# Patient Record
Sex: Female | Born: 1955 | Race: White | Hispanic: No | Smoking: Current every day smoker
Health system: Southern US, Community
[De-identification: ages and names within clinical notes are randomized; demographics above are authoritative.]

## PROBLEM LIST (undated history)

## (undated) DIAGNOSIS — Z8781 Personal history of (healed) traumatic fracture: Secondary | ICD-10-CM

## (undated) HISTORY — DX: Personal history of (healed) traumatic fracture: Z87.81

---

## 1999-02-16 ENCOUNTER — Emergency Department (HOSPITAL_COMMUNITY): Admission: EM | Admit: 1999-02-16 | Discharge: 1999-02-16 | Payer: Self-pay

## 1999-02-16 ENCOUNTER — Encounter: Payer: Self-pay | Admitting: Emergency Medicine

## 1999-02-25 ENCOUNTER — Emergency Department (HOSPITAL_COMMUNITY): Admission: EM | Admit: 1999-02-25 | Discharge: 1999-02-25 | Payer: Self-pay | Admitting: Emergency Medicine

## 2000-09-01 ENCOUNTER — Encounter: Payer: Self-pay | Admitting: Emergency Medicine

## 2000-09-01 ENCOUNTER — Emergency Department (HOSPITAL_COMMUNITY): Admission: EM | Admit: 2000-09-01 | Discharge: 2000-09-01 | Payer: Self-pay | Admitting: Emergency Medicine

## 2000-11-11 ENCOUNTER — Emergency Department (HOSPITAL_COMMUNITY): Admission: EM | Admit: 2000-11-11 | Discharge: 2000-11-11 | Payer: Self-pay

## 2013-06-15 ENCOUNTER — Encounter (HOSPITAL_COMMUNITY): Payer: Self-pay | Admitting: Emergency Medicine

## 2013-06-15 ENCOUNTER — Emergency Department (HOSPITAL_COMMUNITY)
Admission: EM | Admit: 2013-06-15 | Discharge: 2013-06-16 | Disposition: A | Discharge status: Home / Self Care | Payer: Self-pay | Attending: Emergency Medicine | Admitting: Emergency Medicine

## 2013-06-15 ENCOUNTER — Emergency Department (HOSPITAL_COMMUNITY): Payer: Self-pay

## 2013-06-15 DIAGNOSIS — F17200 Nicotine dependence, unspecified, uncomplicated: Secondary | ICD-10-CM | POA: Insufficient documentation

## 2013-06-15 DIAGNOSIS — Y92410 Unspecified street and highway as the place of occurrence of the external cause: Secondary | ICD-10-CM | POA: Insufficient documentation

## 2013-06-15 DIAGNOSIS — S82209A Unspecified fracture of shaft of unspecified tibia, initial encounter for closed fracture: Secondary | ICD-10-CM

## 2013-06-15 DIAGNOSIS — L559 Sunburn, unspecified: Secondary | ICD-10-CM | POA: Insufficient documentation

## 2013-06-15 DIAGNOSIS — F10929 Alcohol use, unspecified with intoxication, unspecified: Secondary | ICD-10-CM

## 2013-06-15 DIAGNOSIS — S82409A Unspecified fracture of shaft of unspecified fibula, initial encounter for closed fracture: Secondary | ICD-10-CM

## 2013-06-15 DIAGNOSIS — S82899A Other fracture of unspecified lower leg, initial encounter for closed fracture: Secondary | ICD-10-CM | POA: Insufficient documentation

## 2013-06-15 DIAGNOSIS — Y9389 Activity, other specified: Secondary | ICD-10-CM | POA: Insufficient documentation

## 2013-06-15 DIAGNOSIS — F1010 Alcohol abuse, uncomplicated: Secondary | ICD-10-CM | POA: Insufficient documentation

## 2013-06-15 DIAGNOSIS — IMO0002 Reserved for concepts with insufficient information to code with codable children: Secondary | ICD-10-CM | POA: Insufficient documentation

## 2013-06-15 MED ORDER — SODIUM CHLORIDE 0.9 % IV BOLUS (SEPSIS)
1000.0000 mL | Freq: Once | INTRAVENOUS | Status: AC
  Administered 2013-06-15: 1000 mL via INTRAVENOUS

## 2013-06-15 MED ORDER — FENTANYL CITRATE 0.05 MG/ML IJ SOLN
25.0000 ug | Freq: Once | INTRAMUSCULAR | Status: AC
  Administered 2013-06-15: 25 ug via INTRAVENOUS
  Filled 2013-06-15: qty 2

## 2013-06-15 MED ORDER — HYDROMORPHONE HCL PF 1 MG/ML IJ SOLN
1.0000 mg | Freq: Once | INTRAMUSCULAR | Status: AC
  Administered 2013-06-15: 1 mg via INTRAVENOUS
  Filled 2013-06-15: qty 1

## 2013-06-15 MED ORDER — ONDANSETRON HCL 4 MG/2ML IJ SOLN
4.0000 mg | Freq: Once | INTRAMUSCULAR | Status: AC
  Administered 2013-06-15: 4 mg via INTRAVENOUS
  Filled 2013-06-15: qty 2

## 2013-06-15 MED ORDER — SODIUM CHLORIDE 0.9 % IV SOLN
INTRAVENOUS | Status: DC
  Administered 2013-06-15: via INTRAVENOUS
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
## 2013-06-16 ENCOUNTER — Emergency Department (HOSPITAL_COMMUNITY): Payer: Self-pay

## 2013-06-16 ENCOUNTER — Encounter (HOSPITAL_COMMUNITY): Payer: Self-pay | Admitting: Radiology

## 2013-06-16 LAB — CBC WITH DIFFERENTIAL/PLATELET
BASOS ABS: 0 10*3/uL (ref 0.0–0.1)
Basophils Relative: 0 % (ref 0–1)
Eosinophils Absolute: 0 10*3/uL (ref 0.0–0.7)
Eosinophils Relative: 0 % (ref 0–5)
HEMATOCRIT: 40.7 % (ref 36.0–46.0)
Hemoglobin: 14.3 g/dL (ref 12.0–15.0)
LYMPHS PCT: 16 % (ref 12–46)
Lymphs Abs: 1.8 10*3/uL (ref 0.7–4.0)
MCH: 34.8 pg — ABNORMAL HIGH (ref 26.0–34.0)
MCHC: 35.1 g/dL (ref 30.0–36.0)
MCV: 99 fL (ref 78.0–100.0)
MONO ABS: 0.5 10*3/uL (ref 0.1–1.0)
MONOS PCT: 4 % (ref 3–12)
Neutro Abs: 8.7 10*3/uL — ABNORMAL HIGH (ref 1.7–7.7)
Neutrophils Relative %: 80 % — ABNORMAL HIGH (ref 43–77)
Platelets: 294 10*3/uL (ref 150–400)
RBC: 4.11 MIL/uL (ref 3.87–5.11)
RDW: 12.9 % (ref 11.5–15.5)
WBC: 11 10*3/uL — ABNORMAL HIGH (ref 4.0–10.5)

## 2013-06-16 LAB — ETHANOL: Alcohol, Ethyl (B): 175 mg/dL — ABNORMAL HIGH (ref 0–11)

## 2013-06-16 LAB — BASIC METABOLIC PANEL
BUN: 7 mg/dL (ref 6–23)
CALCIUM: 8.4 mg/dL (ref 8.4–10.5)
CO2: 22 meq/L (ref 19–32)
CREATININE: 0.48 mg/dL — AB (ref 0.50–1.10)
Chloride: 99 mEq/L (ref 96–112)
GFR calc Af Amer: >90 mL/min (ref >90)
GFR calc non Af Amer: >90 mL/min (ref >90)
Glucose, Bld: 96 mg/dL (ref 70–99)
Potassium: 4.5 mEq/L (ref 3.7–5.3)
Sodium: 135 mEq/L — ABNORMAL LOW (ref 137–147)

## 2013-06-16 MED ORDER — FENTANYL CITRATE 0.05 MG/ML IJ SOLN
50.0000 ug | INTRAMUSCULAR | Status: DC | PRN
  Administered 2013-06-16: 50 ug via INTRAVENOUS
  Filled 2013-06-16: qty 2

## 2013-06-16 MED ORDER — HYDROCODONE-ACETAMINOPHEN 5-325 MG PO TABS: 1.0000 | ORAL_TABLET | Freq: Four times a day (QID) | ORAL | Status: DC | PRN

## 2013-06-16 MED ORDER — IOHEXOL 300 MG/ML  SOLN
80.0000 mL | Freq: Once | INTRAMUSCULAR | Status: AC | PRN
  Administered 2013-06-16: 80 mL via INTRAVENOUS

## 2013-06-16 MED ORDER — IBUPROFEN 600 MG PO TABS: 600.0000 mg | ORAL_TABLET | Freq: Four times a day (QID) | ORAL | Status: DC | PRN
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
## 2013-06-16 NOTE — Discharge Instructions (Signed)
Tibial and Fibular Fracture, Adult    Tibial and fibular fracture is a break in the bones of your lower leg (tibia and fibula). The tibia is the larger of these two bones. The fibula is the smaller of the two bones. It is on the outer side of your leg.  CAUSES  Low-energy injuries, such as a fall from ground level.  High-energy injuries, such as motor vehicle injuries, gunshot wounds, or high-speed sports collisions. RISK FACTORS  Jumping activities.  Repetitive stress, such as long-distance running.  Participation in sports.  Osteoporosis.  Advanced age. SIGNS AND SYMPTOMS  Pain.  Swelling.  Inability to put weight on your injured leg.  Bone deformities at the site of your injury.  Bruising. DIAGNOSIS  Tibial and fibular fractures are diagnosed with the use of X-ray exams.  TREATMENT  If you have a simple fracture of these two bones, they can be treated with simple immobilization. A cast or splint will be used on your leg to keep it from moving while it heals. Then you can begin range-of-motion exercises to regain your knee motion.  HOME CARE INSTRUCTIONS  Apply ice to your leg:  Put ice in a plastic bag.  Place a towel between your skin and the bag.  Leave the ice on for 20 minutes, 2 3 times a day.  If you have a plaster or fiberglass cast:  Do not try to scratch the skin under the cast using sharp or pointed objects.  Check the skin around the cast every day. You may put lotion on any red or sore areas.  Keep your cast dry and clean.  If you have a plaster splint:  Wear the splint as directed.  You may loosen the elastic around the splint if your toes become numb, tingle, or turn cold or blue.  Do not put pressure on any part of your cast or splint until it is fully hardened, because it may deform.  Your cast or splint can be protected during bathing with a plastic bag. Do not lower the cast or splint into water.  Use crutches as directed.  Only take over-the-counter or  prescription medicines for pain, discomfort, or fever as directed by your health care provider.  Follow all instructions given to you by your health care provider.  Make and keep all follow-up appointments. SEEK MEDICAL CARE IF:  Your pain is becoming worse rather than better or is not controlled with medicines.  You have increased swelling or redness in the foot.  You begin to lose feeling in your foot or toes. SEEK IMMEDIATE MEDICAL CARE IF:  You develop a cold or blue foot or toes on the injured side.  You develop severe pain in your injured leg, especially if the pain is increased with movement of your toes. MAKE SURE YOU:  Understand these instructions.  Will watch your condition.  Will get help right away if you are not doing well or get worse. Document Released: 10/18/2001 Document Revised: 11/16/2012 Document Reviewed: 09/07/2012  [facility] Patient Information 2014 [employer], [place].

## 2013-06-16 NOTE — ED Notes (Signed)
Pt's spO2 alarm went off stating that the pt was satting 63% on room air. This RN placed the pt on 3L of O2, and her sats increased to 93%. Dierdre Highmanpitz, MD is aware.

## 2013-06-16 NOTE — ED Notes (Signed)
Pt attempting to put on paper scrubs by herself. This RN offered to help and was scolded by the pt. The pt eventually let this RN help her get dressed, and into a wheel chair.

## 2013-06-16 NOTE — ED Notes (Signed)
Ortho responded to page. Will see the pt shortly.

## 2013-06-16 NOTE — ED Notes (Signed)
Opitz, MD at bedside. 
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
## 2013-06-16 NOTE — ED Notes (Signed)
Pt satting 87% on room air. This RN placed the pt back on 3L of O2, and her sats increased to 98%. Dierdre Highmanpitz, MD is aware, and agrees to the let the pt be discharged.

## 2013-06-16 NOTE — ED Notes (Signed)
Ortho tech at bedside 
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
## 2016-09-03 ENCOUNTER — Encounter (HOSPITAL_COMMUNITY): Payer: Self-pay | Admitting: Emergency Medicine

## 2016-09-03 ENCOUNTER — Emergency Department (HOSPITAL_COMMUNITY)
Admission: EM | Admit: 2016-09-03 | Discharge: 2016-09-03 | Disposition: A | Discharge status: Home / Self Care | Payer: Self-pay | Attending: Emergency Medicine | Admitting: Emergency Medicine

## 2016-09-03 DIAGNOSIS — F17210 Nicotine dependence, cigarettes, uncomplicated: Secondary | ICD-10-CM | POA: Insufficient documentation

## 2016-09-03 DIAGNOSIS — F22 Delusional disorders: Secondary | ICD-10-CM | POA: Diagnosis present

## 2016-09-03 DIAGNOSIS — Z202 Contact with and (suspected) exposure to infections with a predominantly sexual mode of transmission: Secondary | ICD-10-CM | POA: Insufficient documentation

## 2016-09-03 DIAGNOSIS — R440 Auditory hallucinations: Secondary | ICD-10-CM | POA: Insufficient documentation

## 2016-09-03 LAB — CBC WITH DIFFERENTIAL/PLATELET
Basophils Absolute: 0 10*3/uL (ref 0.0–0.1)
Basophils Relative: 0 %
EOS PCT: 1 %
Eosinophils Absolute: 0.1 10*3/uL (ref 0.0–0.7)
HEMATOCRIT: 41.5 % (ref 36.0–46.0)
Hemoglobin: 14.5 g/dL (ref 12.0–15.0)
LYMPHS PCT: 24 %
Lymphs Abs: 2.9 10*3/uL (ref 0.7–4.0)
MCH: 34.6 pg — AB (ref 26.0–34.0)
MCHC: 34.9 g/dL (ref 30.0–36.0)
MCV: 99 fL (ref 78.0–100.0)
Monocytes Absolute: 0.7 10*3/uL (ref 0.1–1.0)
Monocytes Relative: 6 %
NEUTROS ABS: 8.4 10*3/uL — AB (ref 1.7–7.7)
Neutrophils Relative %: 69 %
Platelets: 333 10*3/uL (ref 150–400)
RBC: 4.19 MIL/uL (ref 3.87–5.11)
RDW: 12.8 % (ref 11.5–15.5)
WBC: 12 10*3/uL — ABNORMAL HIGH (ref 4.0–10.5)

## 2016-09-03 LAB — URINALYSIS, ROUTINE W REFLEX MICROSCOPIC
Glucose, UA: NEGATIVE mg/dL
HGB URINE DIPSTICK: NEGATIVE
Ketones, ur: 20 mg/dL — AB
Leukocytes, UA: NEGATIVE
Nitrite: NEGATIVE
Protein, ur: NEGATIVE mg/dL
Specific Gravity, Urine: 1.025 (ref 1.005–1.030)
pH: 5 (ref 5.0–8.0)

## 2016-09-03 LAB — BASIC METABOLIC PANEL
ANION GAP: 9 (ref 5–15)
BUN: 12 mg/dL (ref 6–20)
CALCIUM: 9.4 mg/dL (ref 8.9–10.3)
CO2: 27 mmol/L (ref 22–32)
CREATININE: 0.57 mg/dL (ref 0.44–1.00)
Chloride: 104 mmol/L (ref 101–111)
GFR calc Af Amer: >60 mL/min (ref >60)
GFR calc non Af Amer: >60 mL/min (ref >60)
Glucose, Bld: 129 mg/dL — ABNORMAL HIGH (ref 65–99)
Potassium: 3.7 mmol/L (ref 3.5–5.1)
Sodium: 140 mmol/L (ref 135–145)

## 2016-09-03 LAB — RAPID URINE DRUG SCREEN, HOSP PERFORMED
Amphetamines: NOT DETECTED
Barbiturates: NOT DETECTED
Benzodiazepines: NOT DETECTED
Cocaine: NOT DETECTED
Opiates: NOT DETECTED
TETRAHYDROCANNABINOL: NOT DETECTED

## 2016-09-03 LAB — ETHANOL: Alcohol, Ethyl (B): <5 mg/dL (ref <5)
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
## 2016-09-03 NOTE — ED Notes (Signed)
Pt sent by Mobile Crisis. With triage pt verbalizes police are watching her all the time; denies SI/HI or A/VH but appears paranoid.
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
## 2016-09-04 LAB — HIV ANTIBODY (ROUTINE TESTING W REFLEX): HIV SCREEN 4TH GENERATION: NONREACTIVE

## 2016-09-15 ENCOUNTER — Encounter (HOSPITAL_COMMUNITY): Payer: Self-pay | Admitting: Family Medicine

## 2016-09-15 DIAGNOSIS — R141 Gas pain: Secondary | ICD-10-CM | POA: Insufficient documentation

## 2016-09-15 DIAGNOSIS — F17210 Nicotine dependence, cigarettes, uncomplicated: Secondary | ICD-10-CM | POA: Insufficient documentation

## 2016-09-15 DIAGNOSIS — R142 Eructation: Secondary | ICD-10-CM | POA: Insufficient documentation

## 2016-09-15 DIAGNOSIS — K5900 Constipation, unspecified: Secondary | ICD-10-CM | POA: Insufficient documentation

## 2016-09-15 DIAGNOSIS — R143 Flatulence: Secondary | ICD-10-CM | POA: Insufficient documentation

## 2016-09-15 NOTE — ED Notes (Signed)
Called pt's name to be triaged, but no response from the waiting room. 

## 2016-09-15 NOTE — ED Triage Notes (Signed)
Patient is complaining of left upper abd pain with gas, constipation, distention, and dizziness. Symptoms started about 3 weeks ago. Denies fever, nausea, and vomiting.

## 2016-09-16 ENCOUNTER — Emergency Department (HOSPITAL_COMMUNITY)
Admission: EM | Admit: 2016-09-16 | Discharge: 2016-09-16 | Disposition: A | Discharge status: Home / Self Care | Payer: Self-pay | Attending: Emergency Medicine | Admitting: Emergency Medicine

## 2016-09-16 DIAGNOSIS — R141 Gas pain: Secondary | ICD-10-CM

## 2016-09-16 DIAGNOSIS — K5900 Constipation, unspecified: Secondary | ICD-10-CM

## 2016-09-16 DIAGNOSIS — R142 Eructation: Secondary | ICD-10-CM

## 2016-09-16 DIAGNOSIS — R143 Flatulence: Secondary | ICD-10-CM

## 2016-09-16 LAB — CBC
HEMATOCRIT: 41.4 % (ref 36.0–46.0)
Hemoglobin: 14.5 g/dL (ref 12.0–15.0)
MCH: 34 pg (ref 26.0–34.0)
MCHC: 35 g/dL (ref 30.0–36.0)
MCV: 97.2 fL (ref 78.0–100.0)
Platelets: 316 10*3/uL (ref 150–400)
RBC: 4.26 MIL/uL (ref 3.87–5.11)
RDW: 13 % (ref 11.5–15.5)
WBC: 9.1 10*3/uL (ref 4.0–10.5)

## 2016-09-16 LAB — COMPREHENSIVE METABOLIC PANEL
ALBUMIN: 3.9 g/dL (ref 3.5–5.0)
ALT: 17 U/L (ref 14–54)
AST: 14 U/L — AB (ref 15–41)
Alkaline Phosphatase: 70 U/L (ref 38–126)
Anion gap: 9 (ref 5–15)
BUN: 8 mg/dL (ref 6–20)
CHLORIDE: 105 mmol/L (ref 101–111)
CO2: 29 mmol/L (ref 22–32)
Calcium: 9.5 mg/dL (ref 8.9–10.3)
Creatinine, Ser: 0.53 mg/dL (ref 0.44–1.00)
GFR calc Af Amer: >60 mL/min (ref >60)
GFR calc non Af Amer: >60 mL/min (ref >60)
GLUCOSE: 97 mg/dL (ref 65–99)
POTASSIUM: 4.4 mmol/L (ref 3.5–5.1)
Sodium: 143 mmol/L (ref 135–145)
Total Bilirubin: 0.3 mg/dL (ref 0.3–1.2)
Total Protein: 7.5 g/dL (ref 6.5–8.1)

## 2016-09-16 LAB — LIPASE, BLOOD: LIPASE: 27 U/L (ref 11–51)

## 2016-09-16 LAB — URINALYSIS, ROUTINE W REFLEX MICROSCOPIC
BILIRUBIN URINE: NEGATIVE
GLUCOSE, UA: NEGATIVE mg/dL
Hgb urine dipstick: NEGATIVE
KETONES UR: NEGATIVE mg/dL
Leukocytes, UA: NEGATIVE
Nitrite: NEGATIVE
PH: 5 (ref 5.0–8.0)
Protein, ur: NEGATIVE mg/dL
Specific Gravity, Urine: 1.008 (ref 1.005–1.030)

## 2016-09-16 MED ORDER — POLYETHYLENE GLYCOL 3350 17 GM/SCOOP PO POWD: 17.0000 g | Freq: Every day | ORAL | 0 refills | Status: DC

## 2016-09-16 NOTE — ED Provider Notes (Signed)
[facility]-EMERGENCY DEPT Provider Note   CSN: [id number] Arrival date & time: 09/15/16  2145    History   Chief Complaint Chief Complaint  Patient presents with  . Abdominal Pain    HPI CLAUDIE BRICKHOUSE is a 61 y.o. female.  61 year old female with a hx of delusional d/o presents to the ED for evaluation of abdominal discomfort. She reports vague discomfort x 3 weeks intermittently. Symptoms associated with intermittent abdominal bloating, constipation, and dizziness. Patient has taken ibuprofen for symptoms in the past, but has run out of this medication recently. She had a normal BM today; states she has noticed increased flatulence as well. Patient denies fever, urinary symptoms such as dysuria or hematuria, melena, hematochezia, nausea, and vomiting. No hx of abdominal surgeries.      History reviewed. No pertinent past medical history.  Patient Active Problem List   Diagnosis Date Noted  . Delusional disorder (HCC) 09/03/2016    History reviewed. No pertinent surgical history.  OB History    No data available       Home Medications    Prior to Admission medications   Medication Sig Start Date End Date Taking? Authorizing Provider  polyethylene glycol powder (GLYCOLAX/MIRALAX) powder Take 17 g by mouth daily. Until daily soft stools  OTC 09/16/16   Antony Madura, PA-C    Family History History reviewed. No pertinent family history.  Social History Social History  Substance Use Topics  . Smoking status: Current Every Day Smoker    Packs/day: 1.00    Types: Cigarettes  . Smokeless tobacco: Never Used  . Alcohol use Yes     Comment: 4 nights a week     Allergies   Patient has no known allergies.   Review of Systems Review of Systems Ten systems reviewed and are negative for acute change, except as noted in the HPI.    Physical Exam Updated Vital Signs BP 102/86 (BP Location: Right Arm)   Pulse 84   Temp 98 F (36.7 C) (Oral)   Resp 18   Ht 5\' 2"   (1.575 m)   Wt 51 kg (112 lb 8 oz)   SpO2 100%   BMI 20.58 kg/m   Physical Exam  Constitutional: She is oriented to person, place, and time. She appears well-developed and well-nourished. No distress.  Nontoxic and in NAD  HENT:  Head: Normocephalic and atraumatic.  Eyes: Conjunctivae and EOM are normal. No scleral icterus.  Neck: Normal range of motion.  Cardiovascular: Normal rate, regular rhythm and intact distal pulses.   Pulmonary/Chest: Effort normal. No respiratory distress. She has no wheezes.  Respirations even and unlabored. Lungs CTAB.  Abdominal: Soft. She exhibits no distension and no mass. There is no tenderness. There is no guarding.  Soft, nontender abdomen. No masses or peritoneal signs. No distension.  Musculoskeletal: Normal range of motion.  Neurological: She is alert and oriented to person, place, and time. She exhibits normal muscle tone. Coordination normal.  Skin: Skin is warm and dry. No rash noted. She is not diaphoretic. No erythema. No pallor.  Psychiatric: She has a normal mood and affect. Her behavior is normal.  Nursing note and vitals reviewed.    ED Treatments / Results  Labs (all labs ordered are listed, but only abnormal results are displayed) Labs Reviewed  COMPREHENSIVE METABOLIC PANEL - Abnormal; Notable for the following:       Result Value   AST 14 (*)    All other components within normal  limits  LIPASE, BLOOD  CBC  URINALYSIS, ROUTINE W REFLEX MICROSCOPIC    EKG  EKG Interpretation None       Radiology No results found.  Procedures Procedures (including critical care time)  Medications Ordered in ED Medications - No data to display   Initial Impression / Assessment and Plan / ED Course  I have reviewed the triage vital signs and the nursing notes.  Pertinent labs & imaging results that were available during my care of the patient were reviewed by me and considered in my medical decision making (see chart for  details).     61 year old female presents to the emergency department for 3 weeks of intermittent abdominal discomfort associated with bloating and constipation. She has had increased flatulence lately and a normal bowel movement today. No nausea or vomiting. No melena or hematochezia. Patient has no evidence of acute surgical abdomen. Laboratory workup reassuring. Given chronicity of symptoms and reassuring exam with stable vital signs, low suspicion for emergent etiology. Plan for continued outpatient management. MiraLAX prescribed given reported history of constipation intermittently. Return precautions provided at discharge. Patient agreeable to plan with no unaddressed concerns.   Final Clinical Impressions(s) / ED Diagnoses   Final diagnoses:  Flatulence, eructation and gas pain  Constipation, unspecified constipation type    New Prescriptions New Prescriptions   POLYETHYLENE GLYCOL POWDER (GLYCOLAX/MIRALAX) POWDER    Take 17 g by mouth daily. Until daily soft stools  OTC     Antony MaduraHumes, Loleta Frommelt, PA-C 09/16/16 [id number]    Gilda CreasePollina, Christopher J, MD 09/16/16 0730

## 2016-10-31 ENCOUNTER — Encounter (HOSPITAL_COMMUNITY): Payer: Self-pay

## 2016-10-31 ENCOUNTER — Emergency Department (HOSPITAL_COMMUNITY): Payer: Self-pay

## 2016-10-31 ENCOUNTER — Emergency Department (HOSPITAL_COMMUNITY)
Admission: EM | Admit: 2016-10-31 | Discharge: 2016-10-31 | Disposition: A | Discharge status: Home / Self Care | Payer: Self-pay | Attending: Emergency Medicine | Admitting: Emergency Medicine

## 2016-10-31 DIAGNOSIS — F17210 Nicotine dependence, cigarettes, uncomplicated: Secondary | ICD-10-CM | POA: Insufficient documentation

## 2016-10-31 DIAGNOSIS — M21372 Foot drop, left foot: Secondary | ICD-10-CM | POA: Insufficient documentation

## 2016-10-31 DIAGNOSIS — I10 Essential (primary) hypertension: Secondary | ICD-10-CM | POA: Insufficient documentation

## 2016-10-31 NOTE — ED Provider Notes (Signed)
[facility]-EMERGENCY DEPT Provider Note   CSN: [id number] Arrival date & time: 10/31/16  1250     History   Chief Complaint Chief Complaint  Patient presents with  . Ankle Pain    HPI Debbie Luna is a 61 y.o. female who presents emergency Department with chief complaint of left ankle weakness. The patient has a history of delusional disorder making history taking difficult as she has some difficulty communicating timeline appropriately. The patient has a history of hypertension and is a daily smoker and does not follow with her physicians. Patient states that she has had 2 recent ankle sprain that has been sleeping on the floor with her ankles and extension. For the past 2 weeks she has had difficulty flexing the ankle and dorsiflexion which has made her ambulation more difficult. She just denies any headaches, changes in vision, unilateral weakness, back pain, saddle anesthesia, radiating pain down the legs. She has no other neurologic complaints. She denies any injuries except for the repeated ankle sprains.  HPI  History reviewed. No pertinent past medical history.  Patient Active Problem List   Diagnosis Date Noted  . Delusional disorder (HCC) 09/03/2016    History reviewed. No pertinent surgical history.  OB History    No data available       Home Medications    Prior to Admission medications   Medication Sig Start Date End Date Taking? Authorizing Provider  polyethylene glycol powder (GLYCOLAX/MIRALAX) powder Take 17 g by mouth daily. Until daily soft stools  OTC 09/16/16   Antony Madura, PA-C    Family History History reviewed. No pertinent family history.  Social History Social History  Substance Use Topics  . Smoking status: Current Every Day Smoker    Packs/day: 1.00    Types: Cigarettes  . Smokeless tobacco: Never Used  . Alcohol use Yes     Comment: 4 nights a week     Allergies   Patient has no known allergies.   Review of Systems Review of  Systems   Ten systems reviewed and are negative for acute change, except as noted in the HPI.    Physical Exam Updated Vital Signs BP (!) 174/95 (BP Location: Right Arm)   Pulse 92   Temp 98.4 F (36.9 C) (Oral)   Resp 18   SpO2 100%   Physical Exam  Constitutional: She is oriented to person, place, and time. She appears well-developed and well-nourished. No distress.  HENT:  Head: Normocephalic and atraumatic.  Eyes: Conjunctivae are normal. No scleral icterus.  Neck: Normal range of motion.  Cardiovascular: Normal rate, regular rhythm and normal heart sounds.  Exam reveals no gallop and no friction rub.   No murmur heard. Pulmonary/Chest: Effort normal and breath sounds normal. No respiratory distress.  Abdominal: Soft. Bowel sounds are normal. She exhibits no distension and no mass. There is no tenderness. There is no guarding.  Neurological: She is alert and oriented to person, place, and time.   Speech is clear and goal oriented, follows commands Major Cranial nerves without deficit, no facial droop Patient with 5 out of 5 strength in bilateral upper and right lower extremity. Isolated weakness with dorsiflexion of the left ankle. DTRs are normal, no hyperreflexia. Sensation normal to light and sharp touch Moves extremities without ataxia, coordination intact Normal finger to nose and rapid alternating movements Neg romberg, no pronator drift Able to toe walk, unable to heel walk on the left    Skin: Skin is warm and dry.  She is not diaphoretic.  Psychiatric: Her behavior is normal.  Nursing note and vitals reviewed.    ED Treatments / Results  Labs (all labs ordered are listed, but only abnormal results are displayed) Labs Reviewed - No data to display  EKG  EKG Interpretation None       Radiology Dg Ankle Complete Left  Result Date: 10/31/2016 CLINICAL DATA:  Dense any toes left foot with swelling. Ankle twisting injury 2 weeks ago. EXAM: LEFT ANKLE  COMPLETE - 3+ VIEW COMPARISON:  None. FINDINGS: There is no evidence of fracture, dislocation, or joint effusion. There is no evidence of arthropathy or other focal bone abnormality. Soft tissues are unremarkable. IMPRESSION: Negative. Electronically Signed   By: Elberta Fortis M.D.   On: 10/31/2016 14:30    Procedures Procedures (including critical care time)  Medications Ordered in ED Medications - No data to display   Initial Impression / Assessment and Plan / ED Course  I have reviewed the triage vital signs and the nursing notes.  Pertinent labs & imaging results that were available during my care of the patient were reviewed by me and considered in my medical decision making (see chart for details).     Patient with isolated footdrop on the left. There are no other neurologic abnormalities. I discussed the case with Dr. Wilford Corner of neurology. He states that given the physical exam findings no imaging of the spine or head is currently necessary. She'll be placed in an AFO splint and given outpatient neurology follow-up. I discussed return precautions with the patient she appears safe for discharge at this time  Final Clinical Impressions(s) / ED Diagnoses   Final diagnoses:  None    New Prescriptions New Prescriptions   No medications on file     Arthor Captain, PA-C 10/31/16 1713    Benjiman Core, MD 11/01/16 1000

## 2016-10-31 NOTE — ED Triage Notes (Signed)
Pt states she is here for ankle pain, she reports she tripped and twisted her left ankle 3 weeks ago, no swelling noted.

## 2016-10-31 NOTE — Discharge Instructions (Signed)
Contact a health care provider if: Your symptoms do not get better in 2-3 months. The weakness or numbness in your leg or foot gets worse.

## 2016-10-31 NOTE — ED Notes (Signed)
Pt requesting HIV test.  Denies symptoms. PA made aware.

## 2016-10-31 NOTE — ED Notes (Signed)
Pt refused splint, left without discharge paperwork.

## 2016-10-31 NOTE — ED Notes (Signed)
This RN paged ortho for splint.

## 2017-02-05 ENCOUNTER — Encounter (HOSPITAL_COMMUNITY): Payer: Self-pay | Admitting: *Deleted

## 2017-02-05 ENCOUNTER — Emergency Department (HOSPITAL_COMMUNITY)
Admission: EM | Admit: 2017-02-05 | Discharge: 2017-02-08 | Disposition: A | Discharge status: Other Acute Inpatient Hospital | Payer: Self-pay | Attending: Emergency Medicine | Admitting: Emergency Medicine

## 2017-02-05 DIAGNOSIS — F17210 Nicotine dependence, cigarettes, uncomplicated: Secondary | ICD-10-CM | POA: Insufficient documentation

## 2017-02-05 DIAGNOSIS — F200 Paranoid schizophrenia: Secondary | ICD-10-CM | POA: Diagnosis present

## 2017-02-05 DIAGNOSIS — Z049 Encounter for examination and observation for unspecified reason: Secondary | ICD-10-CM

## 2017-02-05 DIAGNOSIS — Z79899 Other long term (current) drug therapy: Secondary | ICD-10-CM | POA: Insufficient documentation

## 2017-02-05 DIAGNOSIS — F22 Delusional disorders: Secondary | ICD-10-CM | POA: Insufficient documentation

## 2017-02-05 LAB — RAPID URINE DRUG SCREEN, HOSP PERFORMED
AMPHETAMINES: NOT DETECTED
BENZODIAZEPINES: NOT DETECTED
Barbiturates: NOT DETECTED
COCAINE: NOT DETECTED
OPIATES: NOT DETECTED
TETRAHYDROCANNABINOL: NOT DETECTED

## 2017-02-05 LAB — COMPREHENSIVE METABOLIC PANEL
ALT: 12 U/L — AB (ref 14–54)
AST: 22 U/L (ref 15–41)
Albumin: 3.8 g/dL (ref 3.5–5.0)
Alkaline Phosphatase: 73 U/L (ref 38–126)
Anion gap: 10 (ref 5–15)
BUN: 19 mg/dL (ref 6–20)
CHLORIDE: 101 mmol/L (ref 101–111)
CO2: 27 mmol/L (ref 22–32)
CREATININE: 0.68 mg/dL (ref 0.44–1.00)
Calcium: 9.2 mg/dL (ref 8.9–10.3)
Glucose, Bld: 113 mg/dL — ABNORMAL HIGH (ref 65–99)
Potassium: 3.7 mmol/L (ref 3.5–5.1)
SODIUM: 138 mmol/L (ref 135–145)
Total Bilirubin: 0.7 mg/dL (ref 0.3–1.2)
Total Protein: 6.8 g/dL (ref 6.5–8.1)

## 2017-02-05 LAB — CBC
HCT: 41.4 % (ref 36.0–46.0)
HEMOGLOBIN: 14.1 g/dL (ref 12.0–15.0)
MCH: 33.7 pg (ref 26.0–34.0)
MCHC: 34.1 g/dL (ref 30.0–36.0)
MCV: 98.8 fL (ref 78.0–100.0)
PLATELETS: 305 10*3/uL (ref 150–400)
RBC: 4.19 MIL/uL (ref 3.87–5.11)
RDW: 14.3 % (ref 11.5–15.5)
WBC: 7.8 10*3/uL (ref 4.0–10.5)

## 2017-02-05 LAB — ETHANOL

## 2017-02-05 LAB — SALICYLATE LEVEL

## 2017-02-05 LAB — ACETAMINOPHEN LEVEL
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
## 2017-02-05 NOTE — ED Notes (Signed)
Hourly rounding reveals patient in room. No complaints, stable, in no acute distress. Q15 minute rounds and monitoring via Security Cameras to continue. 

## 2017-02-05 NOTE — ED Provider Notes (Signed)
[facility]-EMERGENCY DEPT Provider Note   CSN: [id number] Arrival date & time: 02/05/17  1832     History   Chief Complaint Chief Complaint  Patient presents with  . IVC    HPI Debbie Luna is a 61 y.o. female.  HPI Patient was brought into the emergency room because of abnormal behavior.  Patient was brought into the emergency room by police.  Patient is not sure who called the police.  She did not call them.  She denies having any complaints and states she is just here for checkup.  She denies any suicidal or homicidal ideation.  When I asked her if she is hearing any voices or seeing things.  Patient started to tell me that there are people who placed cameras  in her house.  She starts talking about a prostitution ring and a game that people are playing with her.  She denies using any drugs.  She does occasionally drink alcohol. History reviewed. No pertinent past medical history.  Patient Active Problem List   Diagnosis Date Noted  . Delusional disorder (HCC) 09/03/2016    History reviewed. No pertinent surgical history.  OB History    No data available       Home Medications    Prior to Admission medications   Medication Sig Start Date End Date Taking? Authorizing Provider  polyethylene glycol powder (GLYCOLAX/MIRALAX) powder Take 17 g by mouth daily. Until daily soft stools  OTC 09/16/16   Antony MaduraHumes, Kelly, PA-C    Family History No family history on file.  Social History Social History   Tobacco Use  . Smoking status: Current Every Day Smoker    Packs/day: 1.00    Types: Cigarettes  . Smokeless tobacco: Never Used  Substance Use Topics  . Alcohol use: Yes    Comment: 4 nights a week  . Drug use: No     Allergies   Patient has no known allergies.   Review of Systems Review of Systems  All other systems reviewed and are negative.    Physical Exam Updated Vital Signs BP 111/65 (BP Location: Left Arm)   Pulse 82    Temp 97.8 F (36.6 C) (Oral)   Resp 18   SpO2 100%   Physical Exam  Constitutional: She appears well-developed and well-nourished. No distress.  HENT:  Head: Normocephalic and atraumatic.  Right Ear: External ear normal.  Left Ear: External ear normal.  Eyes: Conjunctivae are normal. Right eye exhibits no discharge. Left eye exhibits no discharge. No scleral icterus.  Neck: Neck supple. No tracheal deviation present.  Cardiovascular: Normal rate, regular rhythm and intact distal pulses.  Pulmonary/Chest: Effort normal and breath sounds normal. No stridor. No respiratory distress. She has no wheezes. She has no rales.  Abdominal: Soft. Bowel sounds are normal. She exhibits no distension. There is no tenderness. There is no rebound and no guarding.  Musculoskeletal: She exhibits no edema or tenderness.  Neurological: She is alert. She has normal strength. No cranial nerve deficit (no facial droop, extraocular movements intact, no slurred speech) or sensory deficit. She exhibits normal muscle tone. She displays no seizure activity. Coordination normal.  Skin: Skin is warm and dry. No rash noted.  Psychiatric: She has a normal mood and affect. Her speech is tangential. Her speech is not rapid and/or pressured and not slurred. Thought content is delusional. She expresses no homicidal and no suicidal ideation. She expresses no suicidal plans and no homicidal plans. She is communicative.  Nursing note and vitals reviewed.    ED Treatments / Results  Labs (all labs ordered are listed, but only abnormal results are displayed) Labs Reviewed  COMPREHENSIVE METABOLIC PANEL - Abnormal; Notable for the following components:      Result Value   Glucose, Bld 113 (*)    ALT 12 (*)    All other components within normal limits  ACETAMINOPHEN LEVEL - Abnormal; Notable for the following components:   Acetaminophen (Tylenol), Serum <10 (*)    All other components within normal limits  ETHANOL    SALICYLATE LEVEL  CBC  RAPID URINE DRUG SCREEN, HOSP PERFORMED    Procedures Procedures (including critical care time)  Medications Ordered in ED Medications - No data to display   Initial Impression / Assessment and Plan / ED Course  I have reviewed the triage vital signs and the nursing notes.  Pertinent labs & imaging results that were available during my care of the patient were reviewed by me and considered in my medical decision making (see chart for details).   Labs reviewed.  No acute medical conditions noted. Pt is medically clear for psychiatric evaluation.  Final Clinical Impressions(s) / ED Diagnoses   Final diagnoses:  Delusions [facility](HCC)    ED Discharge Orders    None       Linwood DibblesKnapp, Kemyra August, MD 02/05/17 2112

## 2017-02-05 NOTE — ED Triage Notes (Addendum)
IVC pt brought in by police. Pt is hearing voices, has full conversations with herself. Pt denies HI/SI.

## 2017-02-05 NOTE — ED Notes (Signed)
Bed: [facility] Expected date:  Expected time:  Means of arrival:  Comments: 

## 2017-02-05 NOTE — ED Notes (Signed)
Hourly rounding reveals patient sleeping in room. No complaints, stable, in no acute distress. Q15 minute rounds and monitoring via Security Cameras to continue. 

## 2017-02-05 NOTE — BH Assessment (Signed)
[facility] Assessment Progress Note  Per Nira ConnJason Berry, NP pt meets criteria for inpt treatment. [facility] at capacity for 500 hall type beds. Pt's nurse Jillyn HiddenGary, RN has been notified of the recommendation. TTS attempted to contact the EDP to advise of the disposition but line continues to ring. Will attempt to notify the EDP at a later time.   Princess BruinsAquicha Delila Kuklinski, MSW, LCSW Therapeutic Triage Specialist  [phone]

## 2017-02-05 NOTE — ED Notes (Signed)
Pt. Transferred to [facility] from ED to room 34 after screening for contraband. Report to include Situation, Background, Assessment and Recommendations from [facility]. Pt. Oriented to unit including Q15 minute rounds as well as the security cameras for their protection. Patient is alert and oriented, warm and dry in no acute distress. Patient denies SI, HI, and AVH. Pt. Encouraged to let me know if needs arise.

## 2017-02-06 ENCOUNTER — Emergency Department (HOSPITAL_COMMUNITY): Payer: Self-pay

## 2017-02-06 DIAGNOSIS — F200 Paranoid schizophrenia: Secondary | ICD-10-CM | POA: Diagnosis present

## 2017-02-06 DIAGNOSIS — R450 Nervousness: Secondary | ICD-10-CM

## 2017-02-06 DIAGNOSIS — R443 Hallucinations, unspecified: Secondary | ICD-10-CM

## 2017-02-06 DIAGNOSIS — F17210 Nicotine dependence, cigarettes, uncomplicated: Secondary | ICD-10-CM

## 2017-02-06 DIAGNOSIS — F22 Delusional disorders: Secondary | ICD-10-CM

## 2017-02-06 DIAGNOSIS — F419 Anxiety disorder, unspecified: Secondary | ICD-10-CM

## 2017-02-06 LAB — URINALYSIS, ROUTINE W REFLEX MICROSCOPIC
Bilirubin Urine: NEGATIVE
GLUCOSE, UA: NEGATIVE mg/dL
HGB URINE DIPSTICK: NEGATIVE
Ketones, ur: NEGATIVE mg/dL
LEUKOCYTES UA: NEGATIVE
Nitrite: NEGATIVE
PH: 6 (ref 5.0–8.0)
Protein, ur: NEGATIVE mg/dL
Specific Gravity, Urine: 1.013 (ref 1.005–1.030)

## 2017-02-06 MED ORDER — RISPERIDONE 1 MG PO TABS
1.0000 mg | ORAL_TABLET | Freq: Two times a day (BID) | ORAL | Status: DC
  Administered 2017-02-06 – 2017-02-08 (×5): 1 mg via ORAL
  Filled 2017-02-06 (×5): qty 1

## 2017-02-06 MED ORDER — HYDROXYZINE HCL 10 MG PO TABS
10.0000 mg | ORAL_TABLET | Freq: Two times a day (BID) | ORAL | Status: DC
  Administered 2017-02-06 – 2017-02-08 (×5): 10 mg via ORAL
  Filled 2017-02-06 (×5): qty 1

## 2017-02-06 NOTE — ED Notes (Signed)
Hourly rounding reveals patient sleeping in room. No complaints, stable, in no acute distress. Q15 minute rounds and monitoring via Security Cameras to continue. 

## 2017-02-06 NOTE — ED Notes (Signed)
Pt ambulatory w/o difficulty to x ray w/ security and x ray tech

## 2017-02-06 NOTE — Consult Note (Signed)
[facility] Consult   Reason for Consult:  Psychosis and delusions Referring Physician:  EDP Patient Identification: Debbie Luna MRN:  [id number] Principal Diagnosis: Paranoid schizophrenia [facility]) Diagnosis:   Patient Active Problem List   Diagnosis Date Noted  . Paranoid schizophrenia ([place]) [F20.0] 02/06/2017    Priority: High  . Delusional disorder ([place]) [F22] 09/03/2016    Total Time spent with patient: 45 minutes  Subjective:   Debbie Luna is a 61 y.o. female patient admitted with psychosis and delusions.  HPI:  Patient with history of Delusions who was IVC'd by her friend due to worsening delusions and psychosis. Patient she has been hearing voices and having full conversations with herself. Also, patient has a fixed believe that people are out to get her, people are are placing recording devices in her home and has not been talking on the phone because she believes it is wiretapped. Collateral information revealed that patient  has been seen wandering in the city and talking to herself.  Past Psychiatric History: as above  Risk to Self: Suicidal Ideation: No Suicidal Intent: No Is patient at risk for suicide?: No Suicidal Plan?: No Access to Means: No What has been your use of drugs/alcohol within the last 12 months?: pt said she can "take it or leave it", per IVC pt drinks beer daily Risk to Others: Homicidal Ideation: No Thoughts of Harm to Others: No Current Homicidal Intent: No Current Homicidal Plan: No Access to Homicidal Means: No History of harm to others?: No Assessment of Violence: None Noted Does patient have access to weapons?: No Criminal Charges Pending?: No Does patient have a court date: No Prior Inpatient Therapy: Prior Inpatient Therapy: No Prior Outpatient Therapy: Prior Outpatient Therapy: No  Past Medical History: History reviewed. No pertinent past medical history. History reviewed. No pertinent surgical history. Family  History: No family history on file. Family Psychiatric  History:  Social History:  Social History   Substance and Sexual Activity  Alcohol Use Yes   Comment: 4 nights a week     Social History   Substance and Sexual Activity  Drug Use No    Social History   Socioeconomic History  . Marital status: Divorced    Spouse name: None  . Number of children: None  . Years of education: None  . Highest education level: None  Social Needs  . Financial resource strain: None  . Food insecurity - worry: None  . Food insecurity - inability: None  . Transportation needs - medical: None  . Transportation needs - non-medical: None  Occupational History  . None  Tobacco Use  . Smoking status: Current Every Day Smoker    Packs/day: 1.00    Types: Cigarettes  . Smokeless tobacco: Never Used  Substance and Sexual Activity  . Alcohol use: Yes    Comment: 4 nights a week  . Drug use: No  . Sexual activity: None  Other Topics Concern  . None  Social History Narrative  . None   Additional Social History:    Allergies:  No Known Allergies  Labs:  Results for orders placed or performed during the hospital encounter of 02/05/17 (from the past 48 hour(s))  Comprehensive metabolic panel     Status: Abnormal   Collection Time: 02/05/17  7:41 PM  Result Value Ref Range   Sodium 138 135 - 145 mmol/L   Potassium 3.7 3.5 - 5.1 mmol/L   Chloride 101 101 - 111 mmol/L   CO2  27 22 - 32 mmol/L   Glucose, Bld 113 (H) 65 - 99 mg/dL   BUN 19 6 - 20 mg/dL   Creatinine, Ser 0.68 0.44 - 1.00 mg/dL   Calcium 9.2 8.9 - 10.3 mg/dL   Total Protein 6.8 6.5 - 8.1 g/dL   Albumin 3.8 3.5 - 5.0 g/dL   AST 22 15 - 41 U/L   ALT 12 (L) 14 - 54 U/L   Alkaline Phosphatase 73 38 - 126 U/L   Total Bilirubin 0.7 0.3 - 1.2 mg/dL   GFR calc non Af Amer >60 >60 mL/min   GFR calc Af Amer >60 >60 mL/min    Comment: (NOTE) The eGFR has been calculated using the CKD EPI equation. This calculation has not been  validated in all clinical situations. eGFR's persistently <60 mL/min signify possible Chronic Kidney Disease.    Anion gap 10 5 - 15  Ethanol     Status: None   Collection Time: 02/05/17  7:41 PM  Result Value Ref Range   Alcohol, Ethyl (B) <10 <10 mg/dL    Comment:        LOWEST DETECTABLE LIMIT FOR SERUM ALCOHOL IS 10 mg/dL FOR MEDICAL PURPOSES ONLY   Salicylate level     Status: None   Collection Time: 02/05/17  7:41 PM  Result Value Ref Range   Salicylate Lvl <9.8 2.8 - 30.0 mg/dL  Acetaminophen level     Status: Abnormal   Collection Time: 02/05/17  7:41 PM  Result Value Ref Range   Acetaminophen (Tylenol), Serum <10 (L) 10 - 30 ug/mL    Comment:        THERAPEUTIC CONCENTRATIONS VARY SIGNIFICANTLY. A RANGE OF 10-30 ug/mL MAY BE AN EFFECTIVE CONCENTRATION FOR MANY PATIENTS. HOWEVER, SOME ARE BEST TREATED AT CONCENTRATIONS OUTSIDE THIS RANGE. ACETAMINOPHEN CONCENTRATIONS >150 ug/mL AT 4 HOURS AFTER INGESTION AND >50 ug/mL AT 12 HOURS AFTER INGESTION ARE OFTEN ASSOCIATED WITH TOXIC REACTIONS.   cbc     Status: None   Collection Time: 02/05/17  7:41 PM  Result Value Ref Range   WBC 7.8 4.0 - 10.5 K/uL   RBC 4.19 3.87 - 5.11 MIL/uL   Hemoglobin 14.1 12.0 - 15.0 g/dL   HCT 41.4 36.0 - 46.0 %   MCV 98.8 78.0 - 100.0 fL   MCH 33.7 26.0 - 34.0 pg   MCHC 34.1 30.0 - 36.0 g/dL   RDW 14.3 11.5 - 15.5 %   Platelets 305 150 - 400 K/uL  Rapid urine drug screen (hospital performed)     Status: None   Collection Time: 02/05/17  8:08 PM  Result Value Ref Range   Opiates NONE DETECTED NONE DETECTED   Cocaine NONE DETECTED NONE DETECTED   Benzodiazepines NONE DETECTED NONE DETECTED   Amphetamines NONE DETECTED NONE DETECTED   Tetrahydrocannabinol NONE DETECTED NONE DETECTED   Barbiturates NONE DETECTED NONE DETECTED    Comment: (NOTE) DRUG SCREEN FOR MEDICAL PURPOSES ONLY.  IF CONFIRMATION IS NEEDED FOR ANY PURPOSE, NOTIFY LAB WITHIN 5 DAYS. LOWEST DETECTABLE  LIMITS FOR URINE DRUG SCREEN Drug Class                     Cutoff (ng/mL) Amphetamine and metabolites    1000 Barbiturate and metabolites    200 Benzodiazepine                 921 Tricyclics and metabolites     300 Opiates and metabolites  300 Cocaine and metabolites        300 THC                            50     Current Facility-Administered Medications  Medication Dose Route Frequency Provider Last Rate Last Dose  . hydrOXYzine (ATARAX/VISTARIL) tablet 10 mg  10 mg Oral BID Devaris Quirk, MD      . risperiDONE (RISPERDAL) tablet 1 mg  1 mg Oral BID Corena Pilgrim, MD       Current Outpatient Medications  Medication Sig Dispense Refill  . polyethylene glycol powder (GLYCOLAX/MIRALAX) powder Take 17 g by mouth daily. Until daily soft stools  OTC (Patient not taking: Reported on 02/05/2017) 119 g 0    Musculoskeletal: Strength & Muscle Tone: within normal limits Gait & Station: normal Patient leans: N/A  Psychiatric Specialty Exam: Physical Exam  Psychiatric: Her speech is normal. Judgment normal. Her affect is labile. She is agitated, aggressive and actively hallucinating. Thought content is paranoid. Cognition and memory are normal.    Review of Systems  Constitutional: Negative.   HENT: Negative.   Eyes: Negative.   Respiratory: Negative.   Cardiovascular: Negative.   Gastrointestinal: Negative.   Genitourinary: Negative.   Musculoskeletal: Negative.   Skin: Negative.   Neurological: Negative.   Endo/Heme/Allergies: Negative.   Psychiatric/Behavioral: Positive for hallucinations. The patient is nervous/anxious.     Blood pressure 101/72, pulse 68, temperature 98.8 F (37.1 C), temperature source Oral, resp. rate 18, SpO2 98 %.There is no height or weight on file to calculate BMI.  General Appearance: Casual  Eye Contact:  Minimal  Speech:  Clear and Coherent  Volume:  Decreased  Mood:  Irritable  Affect:  Blunt  Thought Process:  Disorganized   Orientation:  Full (Time, Place, and Person)  Thought Content:  Delusions and Hallucinations: Auditory  Suicidal Thoughts:  No  Homicidal Thoughts:  No  Memory:  Immediate;   Fair Recent;   Fair Remote;   Fair  Judgement:  Poor  Insight:  Shallow  Psychomotor Activity:  Psychomotor Retardation  Concentration:  Concentration: Fair and Attention Span: Fair  Recall:  [employer] of Knowledge:  Fair  Language:  Good  Akathisia:  No  Handed:  Right  AIMS (if indicated):     Assets:  Communication Skills  ADL's:  Intact  Cognition:  WNL  Sleep:   poor     Treatment Plan Summary: Daily contact with patient to assess and evaluate symptoms and progress in treatment and Medication management Start Risperdal 1 mg bid for psychosis/delusion and Hydroxyzine 10 mg bid for EPD prevention.  Disposition: Recommend psychiatric Inpatient admission when medically cleared.  Corena Pilgrim, MD 02/06/2017 12:23 PM

## 2017-02-06 NOTE — ED Notes (Signed)
Report to include Situation, Background, Assessment, and Recommendations received from Janie RN. Patient alert and oriented, warm and dry, in no acute distress. Patient denies SI, HI, AVH and pain. Patient made aware of Q15 minute rounds and security cameras for their safety. Patient instructed to come to me with needs or concerns.  

## 2017-02-06 NOTE — ED Notes (Signed)
ekg complete, shown to jamison dnp and edp.  Pt is aware that we need a urine sample.

## 2017-02-06 NOTE — ED Notes (Signed)
Eating lunch, pt is aware that she will not be dc'd today.

## 2017-02-07 NOTE — Consult Note (Signed)
[facility] Psych ED Progress Note  02/07/2017 10:45 AM Debbie Luna  MRN:  [id number] Subjective:''I guess I am doing better today.''  Objective: Patient was seen, interviewed, chart was reviewed and case discussed with treatment team. Patient now endorses decreased psychosis and paranoia. She is still hearing voices occasionally but not as bad as few days ago. However, patient still has paranoia, she has a fixed believe that people are out to get her and  people are  placing recording devices in her home. She denies suicidal/homicidal thoughts.  Principal Problem: Paranoid schizophrenia ([place]) Diagnosis:   Patient Active Problem List   Diagnosis Date Noted  . Paranoid schizophrenia ([place]) [F20.0] 02/06/2017    Priority: High  . Delusional disorder ([place]) [F22] 09/03/2016   Total Time spent with patient: 30 minutes  Past Psychiatric History: Delusional disorder  Past Medical History: History reviewed. No pertinent past medical history. History reviewed. No pertinent surgical history. Family History: No family history on file. Family Psychiatric  History: unknown Social History:  Social History   Substance and Sexual Activity  Alcohol Use Yes   Comment: 4 nights a week     Social History   Substance and Sexual Activity  Drug Use No    Social History   Socioeconomic History  . Marital status: Divorced    Spouse name: None  . Number of children: None  . Years of education: None  . Highest education level: None  Social Needs  . Financial resource strain: None  . Food insecurity - worry: None  . Food insecurity - inability: None  . Transportation needs - medical: None  . Transportation needs - non-medical: None  Occupational History  . None  Tobacco Use  . Smoking status: Current Every Day Smoker    Packs/day: 1.00    Types: Cigarettes  . Smokeless tobacco: Never Used  Substance and Sexual Activity  . Alcohol use: Yes    Comment: 4 nights a week  . Drug use: No  .  Sexual activity: None  Other Topics Concern  . None  Social History Narrative  . None    Sleep: Fair  Appetite:  Fair  Current Medications: Current Facility-Administered Medications  Medication Dose Route Frequency Provider Last Rate Last Dose  . hydrOXYzine (ATARAX/VISTARIL) tablet 10 mg  10 mg Oral BID Corena Pilgrim, MD   10 mg at 02/07/17 0924  . risperiDONE (RISPERDAL) tablet 1 mg  1 mg Oral BID Corena Pilgrim, MD   1 mg at 02/07/17 3664   Current Outpatient Medications  Medication Sig Dispense Refill  . polyethylene glycol powder (GLYCOLAX/MIRALAX) powder Take 17 g by mouth daily. Until daily soft stools  OTC (Patient not taking: Reported on 02/05/2017) 119 g 0    Lab Results:  Results for orders placed or performed during the hospital encounter of 02/05/17 (from the past 48 hour(s))  Comprehensive metabolic panel     Status: Abnormal   Collection Time: 02/05/17  7:41 PM  Result Value Ref Range   Sodium 138 135 - 145 mmol/L   Potassium 3.7 3.5 - 5.1 mmol/L   Chloride 101 101 - 111 mmol/L   CO2 27 22 - 32 mmol/L   Glucose, Bld 113 (H) 65 - 99 mg/dL   BUN 19 6 - 20 mg/dL   Creatinine, Ser 0.68 0.44 - 1.00 mg/dL   Calcium 9.2 8.9 - 10.3 mg/dL   Total Protein 6.8 6.5 - 8.1 g/dL   Albumin 3.8 3.5 - 5.0 g/dL  AST 22 15 - 41 U/L   ALT 12 (L) 14 - 54 U/L   Alkaline Phosphatase 73 38 - 126 U/L   Total Bilirubin 0.7 0.3 - 1.2 mg/dL   GFR calc non Af Amer >60 >60 mL/min   GFR calc Af Amer >60 >60 mL/min    Comment: (NOTE) The eGFR has been calculated using the CKD EPI equation. This calculation has not been validated in all clinical situations. eGFR's persistently <60 mL/min signify possible Chronic Kidney Disease.    Anion gap 10 5 - 15  Ethanol     Status: None   Collection Time: 02/05/17  7:41 PM  Result Value Ref Range   Alcohol, Ethyl (B) <10 <10 mg/dL    Comment:        LOWEST DETECTABLE LIMIT FOR SERUM ALCOHOL IS 10 mg/dL FOR MEDICAL PURPOSES ONLY    Salicylate level     Status: None   Collection Time: 02/05/17  7:41 PM  Result Value Ref Range   Salicylate Lvl <4.1 2.8 - 30.0 mg/dL  Acetaminophen level     Status: Abnormal   Collection Time: 02/05/17  7:41 PM  Result Value Ref Range   Acetaminophen (Tylenol), Serum <10 (L) 10 - 30 ug/mL    Comment:        THERAPEUTIC CONCENTRATIONS VARY SIGNIFICANTLY. A RANGE OF 10-30 ug/mL MAY BE AN EFFECTIVE CONCENTRATION FOR MANY PATIENTS. HOWEVER, SOME ARE BEST TREATED AT CONCENTRATIONS OUTSIDE THIS RANGE. ACETAMINOPHEN CONCENTRATIONS >150 ug/mL AT 4 HOURS AFTER INGESTION AND >50 ug/mL AT 12 HOURS AFTER INGESTION ARE OFTEN ASSOCIATED WITH TOXIC REACTIONS.   cbc     Status: None   Collection Time: 02/05/17  7:41 PM  Result Value Ref Range   WBC 7.8 4.0 - 10.5 K/uL   RBC 4.19 3.87 - 5.11 MIL/uL   Hemoglobin 14.1 12.0 - 15.0 g/dL   HCT 41.4 36.0 - 46.0 %   MCV 98.8 78.0 - 100.0 fL   MCH 33.7 26.0 - 34.0 pg   MCHC 34.1 30.0 - 36.0 g/dL   RDW 14.3 11.5 - 15.5 %   Platelets 305 150 - 400 K/uL  Rapid urine drug screen (hospital performed)     Status: None   Collection Time: 02/05/17  8:08 PM  Result Value Ref Range   Opiates NONE DETECTED NONE DETECTED   Cocaine NONE DETECTED NONE DETECTED   Benzodiazepines NONE DETECTED NONE DETECTED   Amphetamines NONE DETECTED NONE DETECTED   Tetrahydrocannabinol NONE DETECTED NONE DETECTED   Barbiturates NONE DETECTED NONE DETECTED    Comment: (NOTE) DRUG SCREEN FOR MEDICAL PURPOSES ONLY.  IF CONFIRMATION IS NEEDED FOR ANY PURPOSE, NOTIFY LAB WITHIN 5 DAYS. LOWEST DETECTABLE LIMITS FOR URINE DRUG SCREEN Drug Class                     Cutoff (ng/mL) Amphetamine and metabolites    1000 Barbiturate and metabolites    200 Benzodiazepine                 937 Tricyclics and metabolites     300 Opiates and metabolites        300 Cocaine and metabolites        300 THC                            50   Urinalysis, Routine w reflex microscopic      Status: None   Collection  Time: 02/06/17  2:06 PM  Result Value Ref Range   Color, Urine YELLOW YELLOW   APPearance CLEAR CLEAR   Specific Gravity, Urine 1.013 1.005 - 1.030   pH 6.0 5.0 - 8.0   Glucose, UA NEGATIVE NEGATIVE mg/dL   Hgb urine dipstick NEGATIVE NEGATIVE   Bilirubin Urine NEGATIVE NEGATIVE   Ketones, ur NEGATIVE NEGATIVE mg/dL   Protein, ur NEGATIVE NEGATIVE mg/dL   Nitrite NEGATIVE NEGATIVE   Leukocytes, UA NEGATIVE NEGATIVE    Blood Alcohol level:  Lab Results  Component Value Date   ETH <10 02/05/2017   ETH <5 09/03/2016    Physical Findings: AIMS:  , ,  ,  ,    CIWA:    COWS:     Musculoskeletal: Strength & Muscle Tone: within normal limits Gait & Station: normal Patient leans: N/A  Psychiatric Specialty Exam: Physical Exam  Psychiatric: Her speech is normal. Judgment normal. Her affect is labile. She is slowed and actively hallucinating. Thought content is paranoid. Cognition and memory are normal.    Review of Systems  Constitutional: Negative.   HENT: Negative.   Eyes: Negative.   Respiratory: Negative.   Cardiovascular: Negative.   Gastrointestinal: Negative.   Genitourinary: Negative.   Musculoskeletal: Negative.   Skin: Negative.   Neurological: Negative.   Endo/Heme/Allergies: Negative.   Psychiatric/Behavioral: Positive for hallucinations.    Blood pressure 105/66, pulse 73, temperature 98.1 F (36.7 C), resp. rate 20, SpO2 99 %.There is no height or weight on file to calculate BMI.  General Appearance: Casual  Eye Contact:  Fair  Speech:  Clear and Coherent  Volume:  Normal  Mood:  Irritable  Affect:  Labile  Thought Process:  Coherent  Orientation:  Full (Time, Place, and Person)  Thought Content:  Hallucinations: Auditory and Paranoid Ideation  Suicidal Thoughts:  No  Homicidal Thoughts:  No  Memory:  Immediate;   Fair Recent;   Fair Remote;   Fair  Judgement:  Fair  Insight:  Shallow  Psychomotor Activity:   Psychomotor Retardation  Concentration:  Concentration: Fair and Attention Span: Fair  Recall:  [employer] of Knowledge:  Fair  Language:  Good  Akathisia:  No  Handed:  Right  AIMS (if indicated):     Assets:  Communication Skills Desire for Improvement  ADL's:  Intact  Cognition:  WNL  Sleep:   fair     Treatment Plan Summary: Daily contact with patient to assess and evaluate symptoms and progress in treatment and Medication management  Continue  Risperdal 1 mg bid for psychosis/delusion and Hydroxyzine 10 mg bid for EPD prevention.  Disposition: Recommend psychiatric Inpatient admission when medically cleared.     Corena Pilgrim, MD 02/07/2017, 10:45 AM

## 2017-02-07 NOTE — Progress Notes (Signed)
Call back from Richard at [facility]: they would like pt to complete 48 hours of detox protocol and would be willing to reconsider her for admission at that time. Garner NashGregory Mir Fullilove, MSW, LCSW Clinical Social Worker 02/07/2017 3:23 PM

## 2017-02-07 NOTE — ED Notes (Signed)
Hourly rounding reveals patient sleeping in room. No complaints, stable, in no acute distress. Q15 minute rounds and monitoring via Security Cameras to continue. 

## 2017-02-07 NOTE — ED Notes (Signed)
Patient is alert and oriented. Patient states she is here to be checked out but would not go into detailed. Patient denies SI/HI and A/V hallucinations. Patient has been calm and cooperative this morning. She has taken all her medications and voices no concerns. Patient provided support and encouragement. Q 15 minute checks in progress and patient remains safe on unit. Monitoring continues.

## 2017-02-07 NOTE — ED Notes (Signed)
Hourly rounding reveals patient in room. No complaints, stable, in no acute distress. Q15 minute rounds and monitoring via Security Cameras to continue. 

## 2017-02-07 NOTE — ED Notes (Signed)
Report to include Situation, Background, Assessment, and Recommendations received from Barbara RN. Patient alert and oriented, warm and dry, in no acute distress. Patient denies SI, HI, AVH and pain. Patient made aware of Q15 minute rounds and security cameras for their safety. Patient instructed to come to me with needs or concerns. 

## 2017-02-07 NOTE — ED Notes (Signed)
Patient given breakfast meal tray. 

## 2017-02-07 NOTE — ED Notes (Signed)
Dinner meal tray given to patient. 

## 2017-02-08 MED ORDER — NICOTINE 21 MG/24HR TD PT24
21.0000 mg | MEDICATED_PATCH | Freq: Every day | TRANSDERMAL | Status: DC
  Administered 2017-02-08: 21 mg via TRANSDERMAL
  Filled 2017-02-08: qty 1

## 2017-02-08 NOTE — BH Assessment (Addendum)
Pt denies SI and HI. She reports she slept well and her appetite is "fine". When asked what brought her to the ED, pt says, "A wellness check." Pt asks when she can go home. Writer explained disposition process and recommendation. Pt says, "I'm not going into some mental hospital."  Roosvelt HarpsJackie Norman DO recommends [facility] hall [facility]. Pt is also appropriate for geropsych inpatient placement.   Evette Cristalaroline Paige Liem Copenhaver, [place] Therapeutic Triage Specialist

## 2017-02-08 NOTE — ED Notes (Signed)
Hourly rounding reveals patient sleeping in room. No complaints, stable, in no acute distress. Q15 minute rounds and monitoring via Security Cameras to continue. 

## 2017-02-08 NOTE — BH Assessment (Signed)
[facility]  Per Juanetta BeetsJacqueline Norman, DO, this pt requires psychiatric hospitalization at this time.  Pt presents under IVC initiated by pt's friend, which Thedore MinsMojeed Akintayo, MD has upheld.  At 15:30 Morrie Sheldonshley calls [facility] to report that pt has been accepted to their facility by Dr Wendall StadeKohl.  Laveda AbbeLaurie Britton Parks, FNP concurs with this decision.  Pt's nurse, Aram BeechamCynthia, has been notified, and agrees to call report to [phone].  [facility]  Doylene Canninghomas Samie Reasons Behavioral Health Coordinator [phone]

## 2018-07-15 IMAGING — DX DG ANKLE COMPLETE 3+V*L*
3 series · 3 of 3 positions shown · non-contrast
Comparison: None.

CLINICAL DATA: Dense any toes left foot with swelling. Ankle
twisting injury 2 weeks ago.

EXAM:
LEFT ANKLE COMPLETE - 3+ VIEW

[ankle ap]
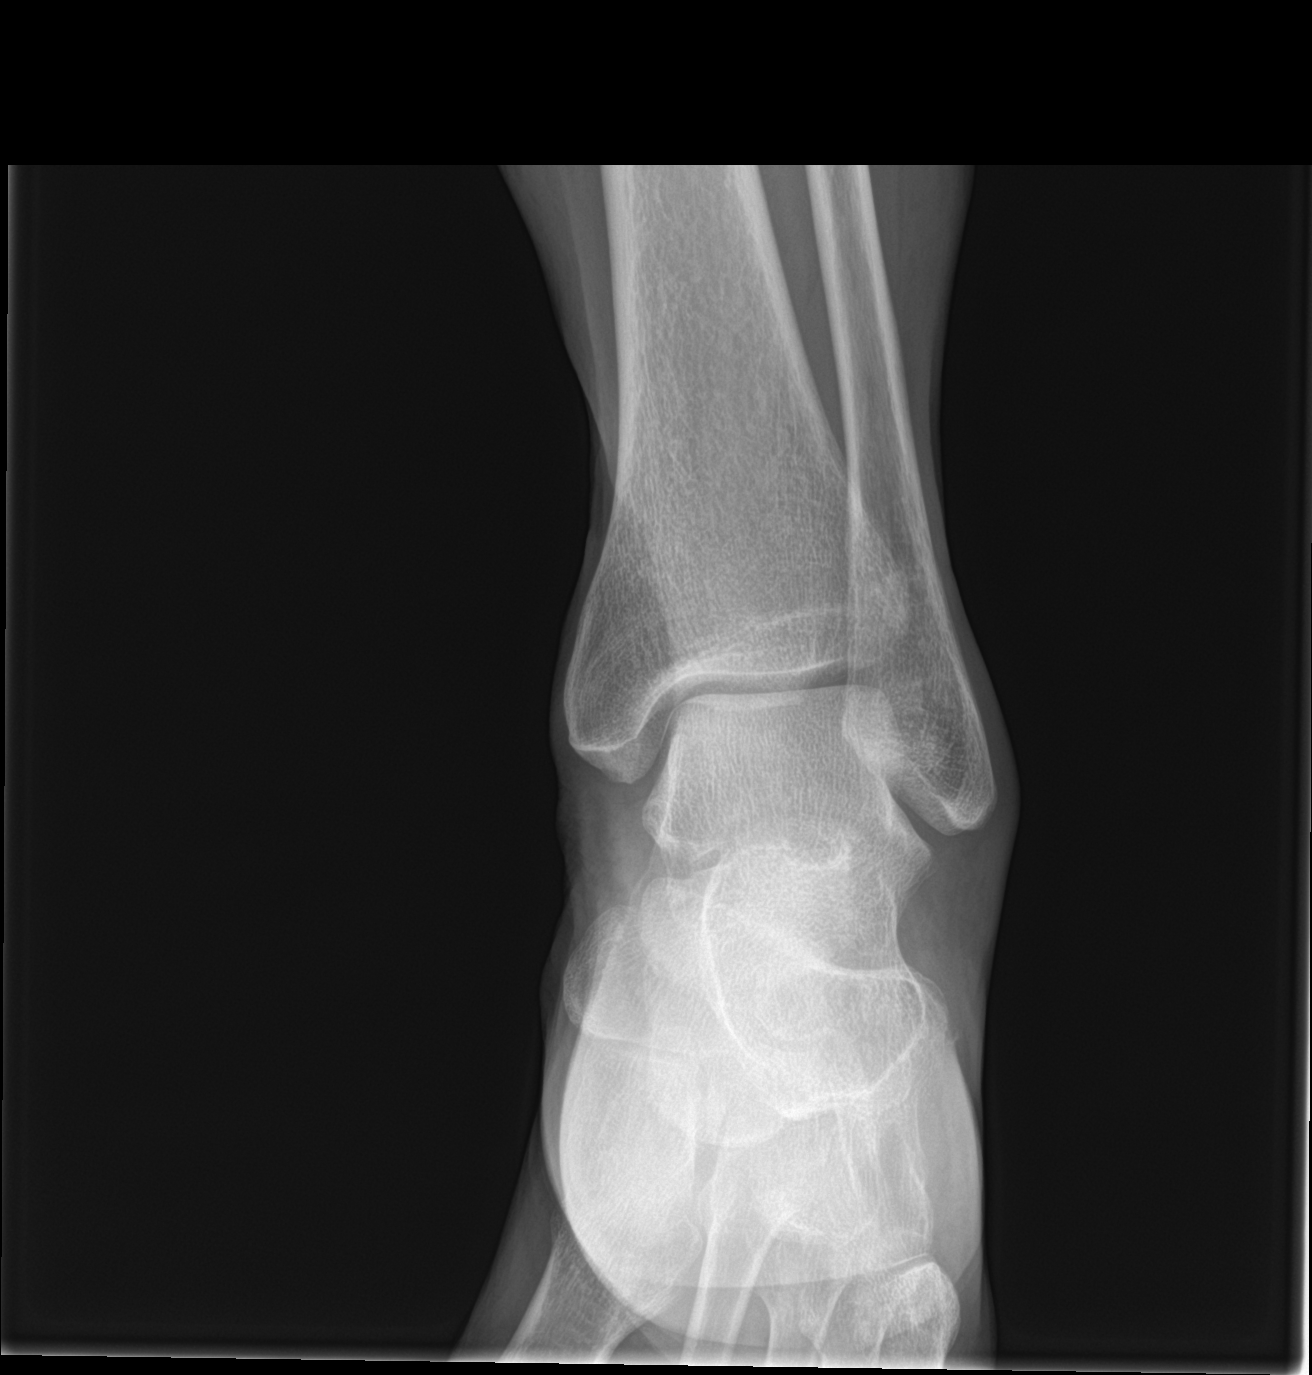

[ankle obl]
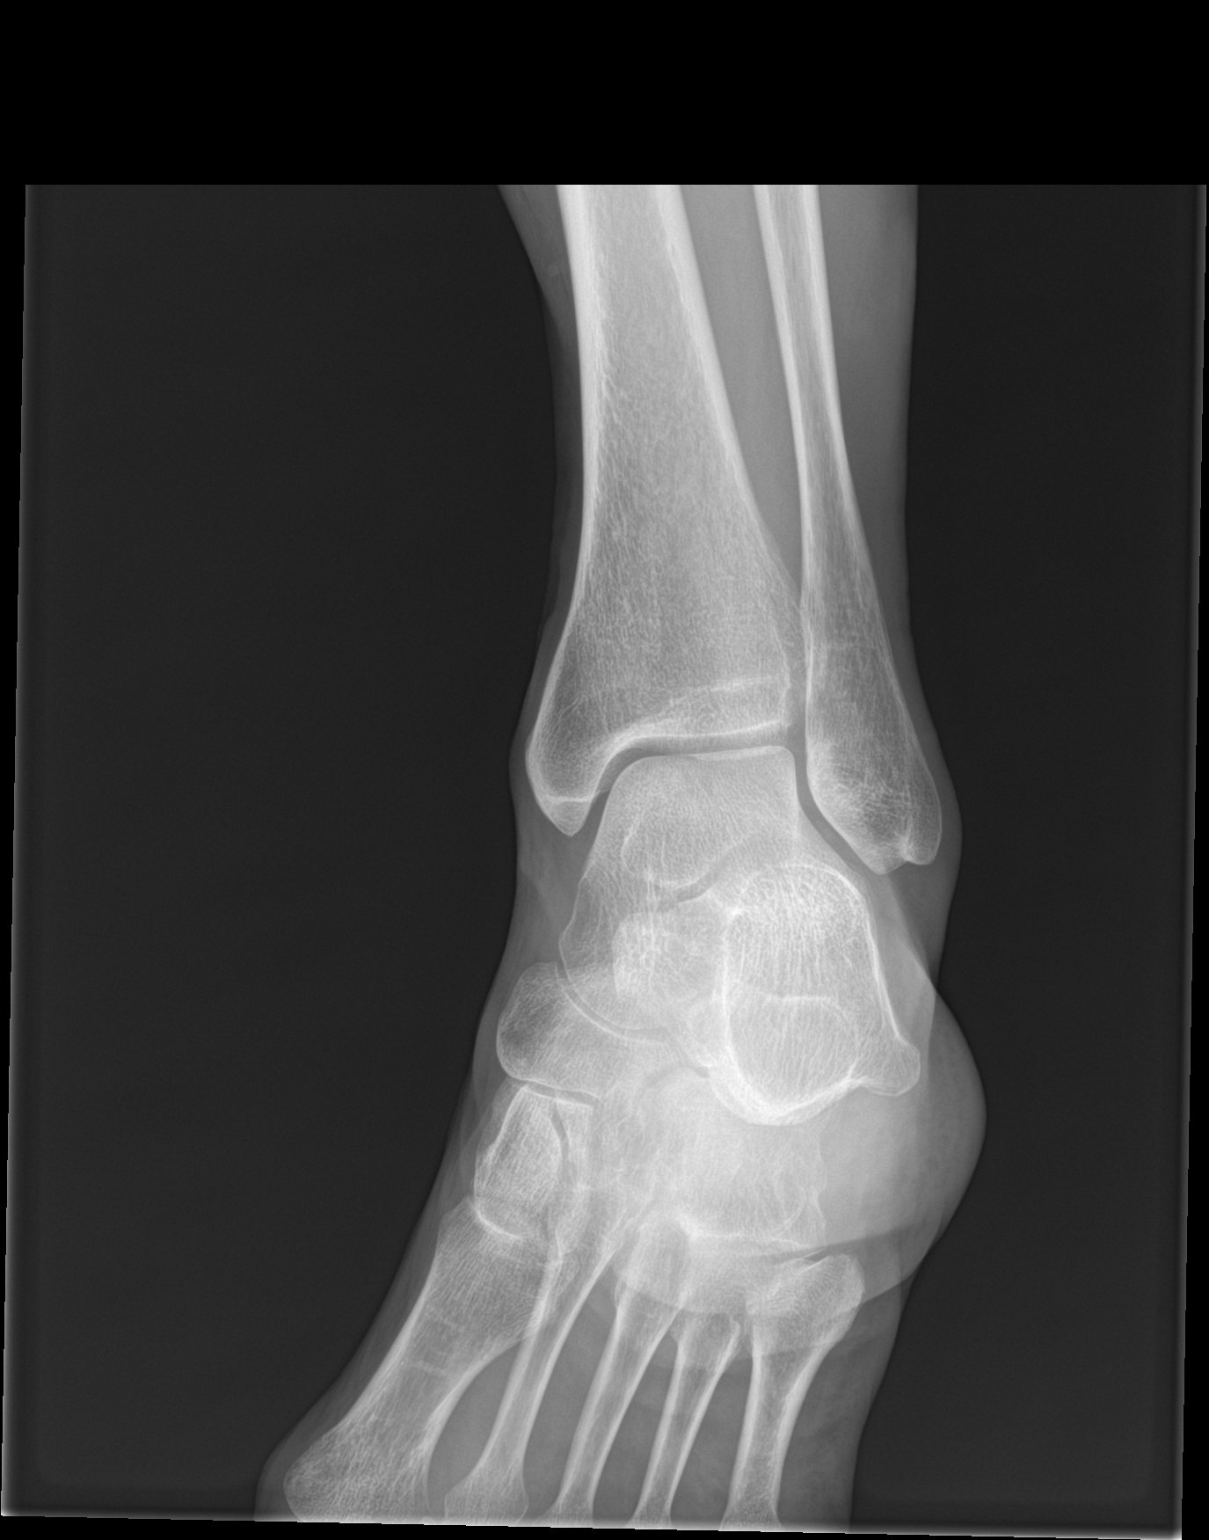

[ankle lat]
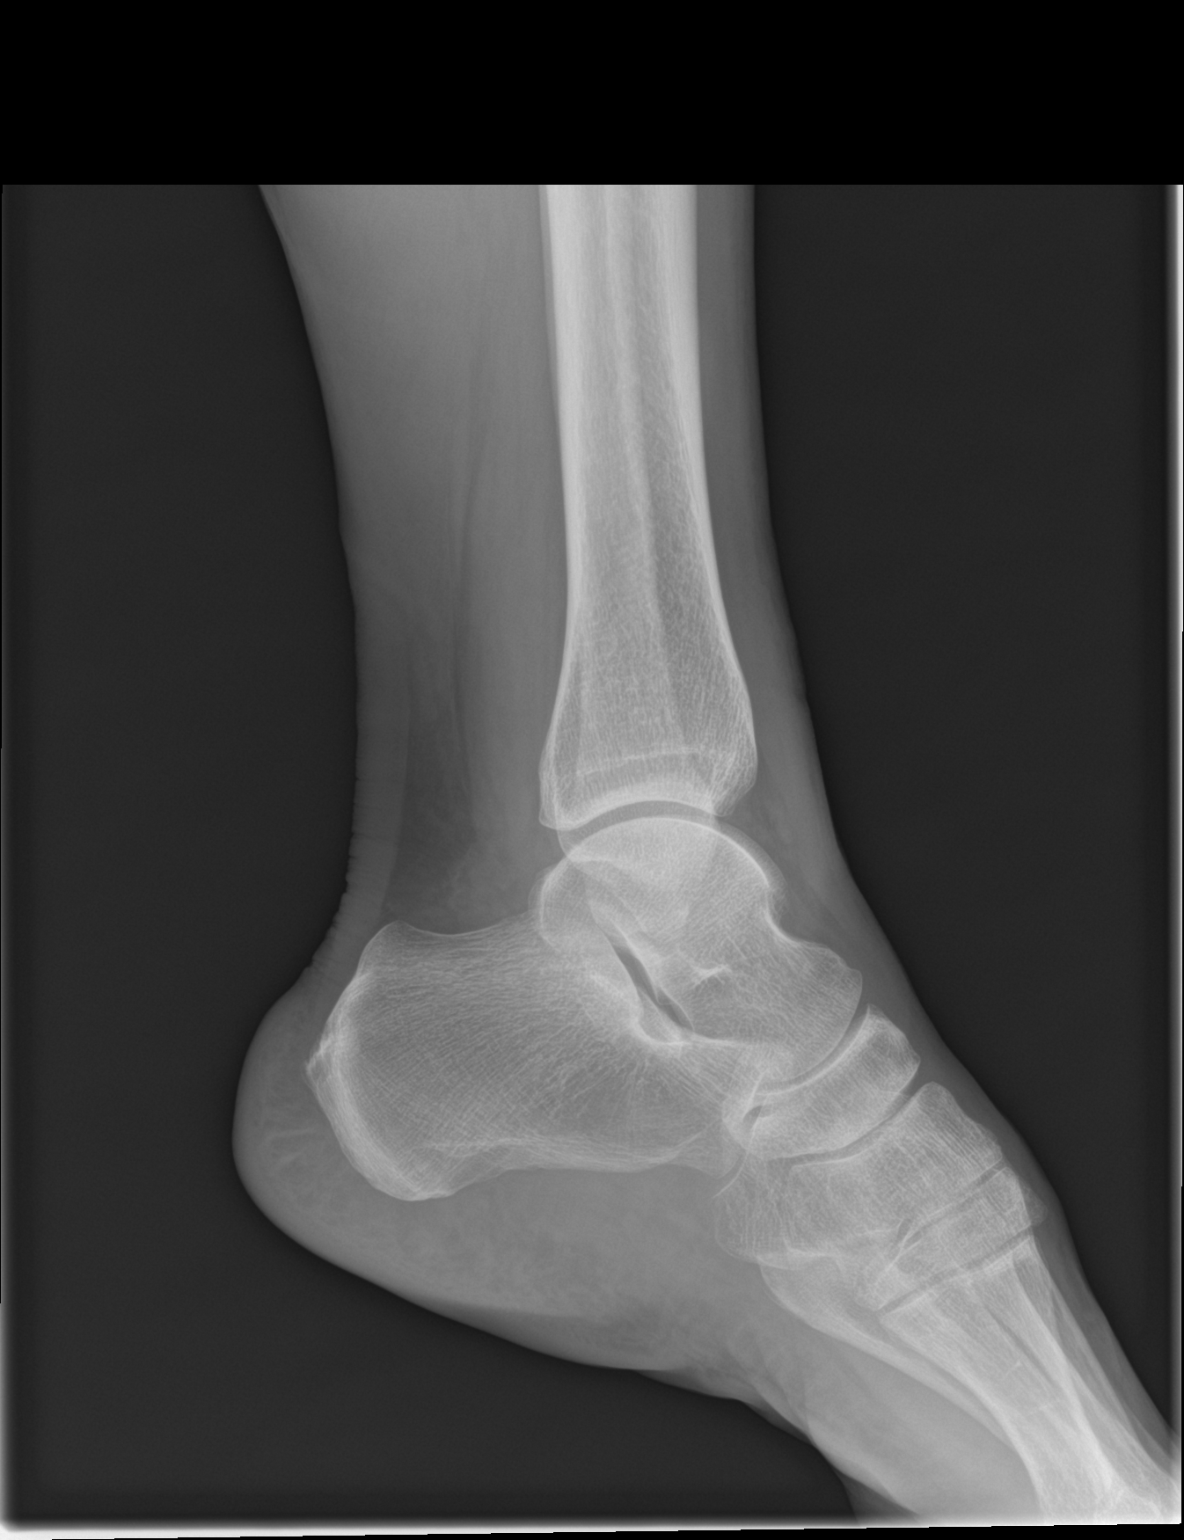

[3 of 3 positions shown; findings below may reference images not displayed]

FINDINGS: There is no evidence of fracture, dislocation, or joint effusion.
There is no evidence of arthropathy or other focal bone abnormality.
Soft tissues are unremarkable.
IMPRESSION: Negative.

## 2021-04-17 ENCOUNTER — Ambulatory Visit: Payer: Self-pay | Admitting: Nurse Practitioner

## 2021-05-12 ENCOUNTER — Ambulatory Visit (INDEPENDENT_AMBULATORY_CARE_PROVIDER_SITE_OTHER): Payer: Medicare PPO | Admitting: Nurse Practitioner

## 2021-05-12 ENCOUNTER — Encounter: Payer: Self-pay | Admitting: Nurse Practitioner

## 2021-05-12 VITALS — BP 158/92 | HR 88 | Temp 97.3°F | Ht 61.5 in | Wt 122.0 lb

## 2021-05-12 DIAGNOSIS — F419 Anxiety disorder, unspecified: Secondary | ICD-10-CM

## 2021-05-12 DIAGNOSIS — R030 Elevated blood-pressure reading, without diagnosis of hypertension: Secondary | ICD-10-CM

## 2021-05-12 DIAGNOSIS — F17200 Nicotine dependence, unspecified, uncomplicated: Secondary | ICD-10-CM

## 2021-05-12 DIAGNOSIS — Z13220 Encounter for screening for lipoid disorders: Secondary | ICD-10-CM

## 2021-05-12 DIAGNOSIS — Z9109 Other allergy status, other than to drugs and biological substances: Secondary | ICD-10-CM

## 2021-05-12 NOTE — Progress Notes (Signed)
? ? ?Careteam: ?Patient Care Team: ?Patient, No Pcp Per (Inactive) as PCP - General (General Practice) ? ?PLACE OF SERVICE:  ?[facility]  ?Advanced Directive information ?Does Patient Have a Medical Advance Directive?: No, Would patient like information on creating a medical advance directive?: No - Patient declined ? ?No Known Allergies ? ?Chief Complaint  ?Patient presents with  ? Establish Care  ?  New patient to establish care. Per patient she does not get any vaccine other than the covid vaccines.   ? ? ? ?HPI: Patient is a 66 y.o. female to establish care.  ?She has not had any routine care.  ?Reports she is very nervous but knows she needs a primary care.  ? ?Seasonal allergies- takes OTC medication for this.  ?Her eyes run all the time. Very watery.  ? ?Hx of broken femur and ankle- has pain when it is cold or hot- uses ibuprofen.  ? ?Repots she has been told she has elevated blood pressure.  ? ?Has a lot of anxiety, has to be doing something with her hands a lot of the time.  ? ? ?Review of Systems:  ?Review of Systems  ?Constitutional:  Negative for chills, fever and weight loss.  ?HENT:  Negative for tinnitus.   ?Respiratory:  Positive for cough. Negative for sputum production and shortness of breath.   ?Cardiovascular:  Negative for chest pain, palpitations and leg swelling.  ?Gastrointestinal:  Negative for abdominal pain, constipation, diarrhea and heartburn.  ?Genitourinary:  Negative for dysuria, frequency and urgency.  ?Musculoskeletal:  Positive for back pain, joint pain and myalgias. Negative for falls.  ?Skin: Negative.   ?Neurological:  Negative for dizziness and headaches.  ?Endo/Heme/Allergies:  Positive for environmental allergies.  ?Psychiatric/Behavioral:  Negative for depression and memory loss. The patient is nervous/anxious. The patient does not have insomnia.   ? ?Past Medical History:  ?Diagnosis Date  ? History of broken leg   ? ?History reviewed. No pertinent surgical  history. ?Social History: ?  reports that she has been smoking cigarettes. She has a 45.00 pack-year smoking history. She has never used smokeless tobacco. She reports current alcohol use of about 6.0 standard drinks per week. She reports that she does not use drugs. ? ?Family History  ?Problem Relation Age of Onset  ? Stroke Mother   ? Diabetes Mother   ? Heart attack Father   ? Cancer Father   ? ? ?Medications: ?Patient's Medications  ?New Prescriptions  ? No medications on file  ?Previous Medications  ? CHLORPHENIRAMINE MALEATE (ALLERGY RELIEF PO)    Take 25 mg by mouth as needed.  ? DIPHENHYDRAMINE HCL PO    Take 1 capsule by mouth daily as needed.  ? IBUPROFEN (ADVIL) 200 MG TABLET    Take 200 mg by mouth daily.  ? MULTIPLE VITAMINS-MINERALS (ONE-A-DAY WOMENS) TABLET    Take 1 tablet by mouth daily.  ? TRIPROLIDINE-PSEUDOEPHEDRINE (ANTIHISTAMINE PO)    Take 1 tablet by mouth 2 (two) times daily.  ?Modified Medications  ? No medications on file  ?Discontinued Medications  ? No medications on file  ? ? ?Physical Exam: ? ?Vitals:  ? 05/12/21 1309  ?BP: (!) 158/92  ?Pulse: 88  ?Temp: (!) 97.3 ?F (36.3 ?C)  ?TempSrc: Temporal  ?SpO2: 99%  ?Weight: 122 lb (55.3 kg)  ?Height: 5' 1.5" (1.562 m)  ? ?Body mass index is 22.68 kg/m?. ?Wt Readings from Last 3 Encounters:  ?05/12/21 122 lb (55.3 kg)  ?09/15/16 112 lb 8  oz (51 kg)  ?09/03/16 113 lb 4 oz (51.4 kg)  ? ? ?Physical Exam ?Constitutional:   ?   General: She is not in acute distress. ?   Appearance: She is well-developed. She is not diaphoretic.  ?HENT:  ?   Head: Normocephalic and atraumatic.  ?   Mouth/Throat:  ?   Pharynx: No oropharyngeal exudate.  ?Eyes:  ?   General: Lids are normal.  ?   Conjunctiva/sclera:  ?   Right eye: Right conjunctiva is injected.  ?   Left eye: Left conjunctiva is injected.  ?   Pupils: Pupils are equal, round, and reactive to light.  ?Cardiovascular:  ?   Rate and Rhythm: Normal rate and regular rhythm.  ?   Heart sounds: Normal heart  sounds.  ?Pulmonary:  ?   Effort: Pulmonary effort is normal.  ?   Breath sounds: Normal breath sounds.  ?Abdominal:  ?   General: Bowel sounds are normal.  ?   Palpations: Abdomen is soft.  ?Musculoskeletal:  ?   Cervical back: Normal range of motion and neck supple.  ?   Right lower leg: No edema.  ?   Left lower leg: No edema.  ?Skin: ?   General: Skin is warm and dry.  ?Neurological:  ?   Mental Status: She is alert.  ?Psychiatric:     ?   Mood and Affect: Mood normal.  ? ? ?Labs reviewed: ?Basic Metabolic Panel: ?No results for input(s): NA, K, CL, CO2, GLUCOSE, BUN, CREATININE, CALCIUM, MG, PHOS, TSH in the last 8760 hours. ?Liver Function Tests: ?No results for input(s): AST, ALT, ALKPHOS, BILITOT, PROT, ALBUMIN in the last 8760 hours. ?No results for input(s): LIPASE, AMYLASE in the last 8760 hours. ?No results for input(s): AMMONIA in the last 8760 hours. ?CBC: ?No results for input(s): WBC, NEUTROABS, HGB, HCT, MCV, PLT in the last 8760 hours. ?Lipid Panel: ?No results for input(s): CHOL, HDL, LDLCALC, TRIG, CHOLHDL, LDLDIRECT in the last 8760 hours. ?TSH: ?No results for input(s): TSH in the last 8760 hours. ?A1C: ?No results found for: HGBA1C ? ? ?Assessment/Plan ?1. Anxiety ?-reports increase in anxiety, very anxious being in the office, she has not had routine healthcare previously.  ? ?2. Smoker ?-encouraged cessation hx of father with lung cancer.  ?- CMP with eGFR([facility]) ?- CBC with Differential/Platelet ?- Lipid Panel ?- CT CHEST LUNG CA SCREEN LOW DOSE W/O CM; Future ? ?3. Environmental allergies ?-using benadryl at this, educated about side effects and use of zyrtec 10 mg daily  ?- Ambulatory referral to Ophthalmology ? ?4. Elevated blood pressure reading ?-elevated reading in office and on recheck, she does not check blood pressure at home ?-low sodium diet encouraged, will follow up blood pressure at next visit.  ?- CMP with eGFR([facility]) ?- CBC with Differential/Platelet ? ?5. Screening for  cholesterol level ?- Lipid Panel ? ? ?Follow up in 6 weeks for AWV ?Carlos American. Dewaine Oats, AGNP ? ?[facility] ?[phone]  ?

## 2021-05-12 NOTE — Patient Instructions (Signed)
To schedule AWV on check out- will get first so needs to be in person.  ? ?To use tylenol 325 mg 2 tablets by mouth every 6 hours as needed for pain vs advil ? ?To use zyrtec (generic) 10 mg by mouth daily for allergies.  ? ? ?

## 2021-05-13 ENCOUNTER — Ambulatory Visit (INDEPENDENT_AMBULATORY_CARE_PROVIDER_SITE_OTHER): Payer: Medicare PPO | Admitting: Nurse Practitioner

## 2021-05-13 DIAGNOSIS — D7589 Other specified diseases of blood and blood-forming organs: Secondary | ICD-10-CM | POA: Diagnosis not present

## 2021-05-13 DIAGNOSIS — Z789 Other specified health status: Secondary | ICD-10-CM | POA: Diagnosis not present

## 2021-05-13 DIAGNOSIS — E785 Hyperlipidemia, unspecified: Secondary | ICD-10-CM | POA: Diagnosis not present

## 2021-05-13 LAB — COMPLETE METABOLIC PANEL WITH GFR
AG Ratio: 1.4 (calc) (ref 1.0–2.5)
ALT: 10 U/L (ref 6–29)
AST: 15 U/L (ref 10–35)
Albumin: 4.2 g/dL (ref 3.6–5.1)
Alkaline phosphatase (APISO): 76 U/L (ref 37–153)
BUN/Creatinine Ratio: 14 (calc) (ref 6–22)
BUN: 6 mg/dL — ABNORMAL LOW (ref 7–25)
CO2: 24 mmol/L (ref 20–32)
Calcium: 9.7 mg/dL (ref 8.6–10.4)
Chloride: 103 mmol/L (ref 98–110)
Creat: 0.42 mg/dL — ABNORMAL LOW (ref 0.50–1.05)
Globulin: 3.1 g/dL (calc) (ref 1.9–3.7)
Glucose, Bld: 93 mg/dL (ref 65–139)
Potassium: 4.6 mmol/L (ref 3.5–5.3)
Sodium: 140 mmol/L (ref 135–146)
Total Bilirubin: 0.5 mg/dL (ref 0.2–1.2)
Total Protein: 7.3 g/dL (ref 6.1–8.1)
eGFR: 107 mL/min/{1.73_m2} (ref >=60)

## 2021-05-13 LAB — CBC WITH DIFFERENTIAL/PLATELET
Absolute Monocytes: 886 cells/uL (ref 200–950)
Basophils Absolute: 25 cells/uL (ref 0–200)
Basophils Relative: 0.2 %
Eosinophils Absolute: 25 cells/uL (ref 15–500)
Eosinophils Relative: 0.2 %
HCT: 45.5 % — ABNORMAL HIGH (ref 35.0–45.0)
Hemoglobin: 15.4 g/dL (ref 11.7–15.5)
Lymphs Abs: 2595 cells/uL (ref 850–3900)
MCH: 34.2 pg — ABNORMAL HIGH (ref 27.0–33.0)
MCHC: 33.8 g/dL (ref 32.0–36.0)
MCV: 101.1 fL — ABNORMAL HIGH (ref 80.0–100.0)
MPV: 10.7 fL (ref 7.5–12.5)
Monocytes Relative: 7.2 %
Neutro Abs: 8770 cells/uL — ABNORMAL HIGH (ref 1500–7800)
Neutrophils Relative %: 71.3 %
Platelets: 276 10*3/uL (ref 140–400)
RBC: 4.5 10*6/uL (ref 3.80–5.10)
RDW: 13.8 % (ref 11.0–15.0)
Total Lymphocyte: 21.1 %
WBC: 12.3 10*3/uL — ABNORMAL HIGH (ref 3.8–10.8)

## 2021-05-13 LAB — LIPID PANEL
Cholesterol: 308 mg/dL — ABNORMAL HIGH (ref <200)
HDL: 86 mg/dL (ref >=50)
LDL Cholesterol (Calc): 204 mg/dL (calc) — ABNORMAL HIGH
Non-HDL Cholesterol (Calc): 222 mg/dL (calc) — ABNORMAL HIGH (ref <130)
Total CHOL/HDL Ratio: 3.6 (calc) (ref <5.0)
Triglycerides: 76 mg/dL (ref <150)

## 2021-05-13 MED ORDER — ATORVASTATIN CALCIUM 10 MG PO TABS: 10.0000 mg | ORAL_TABLET | Freq: Every day | ORAL | 3 refills | Status: DC

## 2021-05-13 NOTE — Progress Notes (Signed)
This service is provided via telemedicine ? ?No vital signs collected/recorded due to the encounter was a telemedicine visit.  ? ?Location of patient (ex: home, work):  Home ? ?Patient consents to a telephone visit:  Yes, see encounter dated 05/13/2021 ? ?Location of the provider (ex: office, home):  [employer] ? ?Name of any referring provider:  N/A ? ?Names of all persons participating in the telemedicine service and their role in the encounter:  Abbey Chatters, Nurse Practitioner, Elveria Royals, CMA, and patient.  ? ?Time spent on call:  7 minutes with medical assistant ? ?

## 2021-05-13 NOTE — Patient Instructions (Signed)
Start Vitamin b12 1000 mcg (over the counter) daily by mouth daily  ? ?START Lipitor (prescription) 10 mg by mouth daily  ?

## 2021-05-13 NOTE — Progress Notes (Signed)
? ? ?Careteam: ?Patient Care Team: ?Sharon Seller, NP as PCP - General (Geriatric Medicine) ? ?Advanced Directive information ?  ? ?No Known Allergies ? ?Chief Complaint  ?Patient presents with  ? Acute Visit  ?  Discuss recent lab results  ? ? ? ?HPI: Patient is a 66 y.o. female to discuss lab work  ? ?Reports she loves fried food, eats a lot of this, creamy sauces.  ? ?Hgb at 15.4 with MCV 101.1 and MCH 34.2- smoker, drinks beer regularly.  ? ?Reports she like drinking beer- likes the taste.  ? ? ?Review of Systems:  ?Review of Systems  ?Constitutional: Negative.   ?HENT: Negative.    ?Cardiovascular:  Negative for chest pain.  ?Skin: Negative.   ?Neurological:  Negative for dizziness and headaches.  ?Psychiatric/Behavioral:  Positive for memory loss. The patient is nervous/anxious and has insomnia.  = ? ?Past Medical History:  ?Diagnosis Date  ? History of broken leg   ? ?No past surgical history on file. ?Social History: ?  reports that she has been smoking cigarettes. She has a 75.00 pack-year smoking history. She has never used smokeless tobacco. She reports current alcohol use of about 6.0 - 8.0 standard drinks per week. She reports that she does not use drugs. ? ?Family History  ?Problem Relation Age of Onset  ? Stroke Mother   ? Diabetes Mother   ? Heart attack Father   ? Cancer Father   ? ? ?Medications: ?Patient's Medications  ?New Prescriptions  ? No medications on file  ?Previous Medications  ? DIPHENHYDRAMINE HCL PO    Take 1 capsule by mouth daily as needed.  ? IBUPROFEN (ADVIL) 200 MG TABLET    Take 200 mg by mouth daily.  ? MULTIPLE VITAMINS-MINERALS (ONE-A-DAY WOMENS) TABLET    Take 1 tablet by mouth daily.  ?Modified Medications  ? No medications on file  ?Discontinued Medications  ? No medications on file  ? ? ?Physical Exam: ? ?There were no vitals filed for this visit. ?There is no height or weight on file to calculate BMI. ?Wt Readings from Last 3 Encounters:  ?05/12/21 122 lb (55.3 kg)   ?09/15/16 112 lb 8 oz (51 kg)  ?09/03/16 113 lb 4 oz (51.4 kg)  ? ? ? ? ?Labs reviewed: ?Basic Metabolic Panel: ?Recent Labs  ?  05/12/21 ?1351  ?NA 140  ?K 4.6  ?CL 103  ?CO2 24  ?GLUCOSE 93  ?BUN 6*  ?CREATININE 0.42*  ?CALCIUM 9.7  ? ?Liver Function Tests: ?Recent Labs  ?  05/12/21 ?1351  ?AST 15  ?ALT 10  ?BILITOT 0.5  ?PROT 7.3  ? ?No results for input(s): LIPASE, AMYLASE in the last 8760 hours. ?No results for input(s): AMMONIA in the last 8760 hours. ?CBC: ?Recent Labs  ?  05/12/21 ?1351  ?WBC 12.3*  ?NEUTROABS 8,770*  ?HGB 15.4  ?HCT 45.5*  ?MCV 101.1*  ?PLT 276  ? ?Lipid Panel: ?Recent Labs  ?  05/12/21 ?1351  ?CHOL 308*  ?HDL 86  ?LDLCALC 204*  ?TRIG 76  ?CHOLHDL 3.6  ? ?TSH: ?No results for input(s): TSH in the last 8760 hours. ?A1C: ?No results found for: HGBA1C ? ? ?Assessment/Plan ?1. Hyperlipidemia LDL goal <100 ?-dietary modifications encouraged, education provided and information mailed.  ?- atorvastatin (LIPITOR) 10 MG tablet; Take 1 tablet (10 mg total) by mouth daily.  Dispense: 90 tablet; Refill: 3 ?-lipid panel ?-cmp ? ?2. Macrocytosis without anemia ?Likely B12 or folate deficiency in the  present of smoking. Will get follow up labs with next blood work ?-to start Vitamin B12 1000 mcg daily at this time ?- B12 and Folate Panel; Future ?- CBC with Differential/Platelet; Future ? ?3. Alcohol use ?- B12 and Folate Panel; Future ?- CBC with Differential/Platelet; Future ? ? ?Next appt: 06/23/2021 fo awv, labs in 3 months followed by visit.  ?Janene Harvey. Janyth Contes, AGNP ? ?[facility] ?[phone]  ? ? ?Virtual Visit via telephone ? ?I connected with patient on 05/13/21 at 11:00 AM EDT by telephone and verified that I am speaking with the correct person using two identifiers. ? ?Location: ?Patient: home ?Provider: [facility] ?  ?I discussed the limitations, risks, security and privacy concerns of performing an evaluation and management service by telephone and the  availability of in person appointments. I also discussed with the patient that there may be a patient responsible charge related to this service. The patient expressed understanding and agreed to proceed. ? ? ?I discussed the assessment and treatment plan with the patient. The patient was provided an opportunity to ask questions and all were answered. The patient agreed with the plan and demonstrated an understanding of the instructions. ?  ?The patient was advised to call back or seek an in-person evaluation if the symptoms worsen or if the condition fails to improve as anticipated. ? ?I provided 15 minutes of non-face-to-face time during this encounter. ? ?Janene Harvey. Janyth Contes, AGNP ?Avs printed and mailed  ?

## 2021-05-22 ENCOUNTER — Telehealth: Payer: Self-pay

## 2021-05-22 DIAGNOSIS — K089 Disorder of teeth and supporting structures, unspecified: Secondary | ICD-10-CM

## 2021-05-22 NOTE — Telephone Encounter (Signed)
Referral placed.

## 2021-05-22 NOTE — Telephone Encounter (Signed)
Patient is calling requesting a referral for an Dentist in [place]. Patient report teeth pain and really needs to see one. Please advise Referral?

## 2021-06-09 ENCOUNTER — Ambulatory Visit: Payer: Self-pay | Admitting: Critical Care Medicine

## 2021-06-10 ENCOUNTER — Telehealth: Payer: Self-pay

## 2021-06-10 NOTE — Telephone Encounter (Signed)
Late Entry: Voicemail was left yesterday after 4:30 pm ? ? ?Patient called to inform Shanda Bumps that she has a dentist appointment on 06/12/2021 ?

## 2021-06-10 NOTE — Telephone Encounter (Signed)
Noted, thank you

## 2021-06-19 ENCOUNTER — Ambulatory Visit (INDEPENDENT_AMBULATORY_CARE_PROVIDER_SITE_OTHER): Payer: Medicare PPO | Admitting: Nurse Practitioner

## 2021-06-19 ENCOUNTER — Telehealth: Payer: Self-pay

## 2021-06-19 DIAGNOSIS — K0889 Other specified disorders of teeth and supporting structures: Secondary | ICD-10-CM | POA: Diagnosis not present

## 2021-06-19 NOTE — Progress Notes (Signed)
? ? ?Careteam: ?Patient Care Team: ?Sharon Seller, NP as PCP - General (Geriatric Medicine) ? ?Advanced Directive information ?  ? ?No Known Allergies ? ?Chief Complaint  ?Patient presents with  ? Acute Visit  ?  Patient complains of tooth pain. No swelling in area. Patient states that she is waiting on insurance before she gets teeth pulled. Patient is taking tylenol for pain.  ? ? ? ?HPI: Patient is a 66 y.o. female via telephone visit for tooth pain.  ?She went to the dentist to get 2 teeth pulled.  ?Teeth hurt. She sent off her medicaid and xray. Awaiting results to get back. Could be 2-3 weeks.  ?She reports pain after she eats.  ?Needs teeth removed.  ?She has been eating soup, fruit,  ?Brushing teeth after she eats and then takes a tylenol it says 24 hours.  ?She is taking 2 tylenol a day.  ?She does not know the dose and reports she is unable to read directions.  ? ?Reports drinking more coffee and water.  ?Review of Systems:  ?Review of Systems  ?Constitutional:  Negative for chills and fever.  ?HENT:    ?     Mouth pain, due to tooth pain.  ?No swelling, redness or discharge   ?Skin:  Negative for itching and rash.  ? ?Past Medical History:  ?Diagnosis Date  ? History of broken leg   ? ?No past surgical history on file. ?Social History: ?  reports that she has been smoking cigarettes. She has a 75.00 pack-year smoking history. She has never used smokeless tobacco. She reports current alcohol use of about 6.0 - 8.0 standard drinks per week. She reports that she does not use drugs. ? ?Family History  ?Problem Relation Age of Onset  ? Stroke Mother   ? Diabetes Mother   ? Heart attack Father   ? Cancer Father   ? ? ?Medications: ?Patient's Medications  ?New Prescriptions  ? No medications on file  ?Previous Medications  ? ATORVASTATIN (LIPITOR) 10 MG TABLET    Take 1 tablet (10 mg total) by mouth daily.  ? DIPHENHYDRAMINE HCL PO    Take 1 capsule by mouth daily as needed.  ? IBUPROFEN (ADVIL) 200 MG  TABLET    Take 200 mg by mouth as needed.  ? MULTIPLE VITAMINS-MINERALS (ONE-A-DAY WOMENS) TABLET    Take 1 tablet by mouth daily.  ?Modified Medications  ? No medications on file  ?Discontinued Medications  ? No medications on file  ? ? ?Physical Exam: ? ?There were no vitals filed for this visit. ?There is no height or weight on file to calculate BMI. ?Wt Readings from Last 3 Encounters:  ?05/12/21 122 lb (55.3 kg)  ?09/15/16 112 lb 8 oz (51 kg)  ?09/03/16 113 lb 4 oz (51.4 kg)  ? ? ? ?Labs reviewed: ?Basic Metabolic Panel: ?Recent Labs  ?  05/12/21 ?1351  ?NA 140  ?K 4.6  ?CL 103  ?CO2 24  ?GLUCOSE 93  ?BUN 6*  ?CREATININE 0.42*  ?CALCIUM 9.7  ? ?Liver Function Tests: ?Recent Labs  ?  05/12/21 ?1351  ?AST 15  ?ALT 10  ?BILITOT 0.5  ?PROT 7.3  ? ?No results for input(s): LIPASE, AMYLASE in the last 8760 hours. ?No results for input(s): AMMONIA in the last 8760 hours. ?CBC: ?Recent Labs  ?  05/12/21 ?1351  ?WBC 12.3*  ?NEUTROABS 8,770*  ?HGB 15.4  ?HCT 45.5*  ?MCV 101.1*  ?PLT 276  ? ?Lipid Panel: ?  Recent Labs  ?  05/12/21 ?1351  ?CHOL 308*  ?HDL 86  ?LDLCALC 204*  ?TRIG 76  ?CHOLHDL 3.6  ? ?TSH: ?No results for input(s): TSH in the last 8760 hours. ?A1C: ?No results found for: HGBA1C ? ? ?Assessment/Plan ?1. Tooth pain ?Continue tylenol, take as directed (avoid alcohol while taking routinely)  ?-soft/puree food ?-can add ibuprofen 200 mg tablet 2 tablets every 6 hours as needed apin ? ? ?Next appt: 06/23/2021 ? As scheduled ?Janene Harvey. Janyth Contes, AGNP ? ?[facility] ?[phone]  ? ? ?Virtual Visit via telephone ? ?I connected with patient on 06/19/21 at 10:20 AM EDT by telephone and verified that I am speaking with the correct person using two identifiers. ? ?Location: ?Patient: home ?Provider: [facility] ?  ?I discussed the limitations, risks, security and privacy concerns of performing an evaluation and management service by telephone and the availability of in person appointments. I  also discussed with the patient that there may be a patient responsible charge related to this service. The patient expressed understanding and agreed to proceed. ? ? ?I discussed the assessment and treatment plan with the patient. The patient was provided an opportunity to ask questions and all were answered. The patient agreed with the plan and demonstrated an understanding of the instructions. ?  ?The patient was advised to call back or seek an in-person evaluation if the symptoms worsen or if the condition fails to improve as anticipated. ? ?I provided 15 minutes of non-face-to-face time during this encounter. ? ?Janene Harvey. Janyth Contes, AGNP ?Avs printed and mailed  ? ?

## 2021-06-19 NOTE — Telephone Encounter (Signed)
Ms. Debbie Luna, Debbie Luna are scheduled for a virtual visit with your provider today.   ? ?Just as we do with appointments in the office, we must obtain your consent to participate.  Your consent will be active for this visit and any virtual visit you may have with one of our providers in the next 365 days.   ? ?If you have a MyChart account, I can also send a copy of this consent to you electronically.  All virtual visits are billed to your insurance company just like a traditional visit in the office.  As this is a virtual visit, video technology does not allow for your provider to perform a traditional examination.  This may limit your provider's ability to fully assess your condition.  If your provider identifies any concerns that need to be evaluated in person or the need to arrange testing such as labs, EKG, etc, we will make arrangements to do so.   ? ?Although advances in technology are sophisticated, we cannot ensure that it will always work on either your end or our end.  If the connection with a video visit is poor, we may have to switch to a telephone visit.  With either a video or telephone visit, we are not always able to ensure that we have a secure connection.   I need to obtain your verbal consent now.   Are you willing to proceed with your visit today?  ? ?Debbie Luna has provided verbal consent on 06/19/2021 for a virtual visit (video or telephone). ? ? ?Elveria Royals, CMA ?06/19/2021  9:51 AM ?  ?

## 2021-06-19 NOTE — Progress Notes (Signed)
This service is provided via telemedicine ? ?No vital signs collected/recorded due to the encounter was a telemedicine visit.  ? ?Location of patient (ex: home, work):  Home ? ?Patient consents to a telephone visit:  Yes, see encounter dated 06/19/2021 ? ?Location of the provider (ex: office, home):  [place] ? ?Name of any referring provider:  N/A ? ?Names of all persons participating in the telemedicine service and their role in the encounter:  Sherrie Mustache, Nurse Practitioner, Carroll Kinds, CMA, and patient.  ? ?Time spent on call:  8 minutes with medical assistant ? ?

## 2021-06-23 ENCOUNTER — Ambulatory Visit (INDEPENDENT_AMBULATORY_CARE_PROVIDER_SITE_OTHER): Payer: Medicare PPO | Admitting: Nurse Practitioner

## 2021-06-23 ENCOUNTER — Encounter: Payer: Self-pay | Admitting: Nurse Practitioner

## 2021-06-23 VITALS — BP 130/88 | HR 90 | Temp 97.6°F | Resp 18 | Ht 65.0 in | Wt 121.0 lb

## 2021-06-23 DIAGNOSIS — D7589 Other specified diseases of blood and blood-forming organs: Secondary | ICD-10-CM

## 2021-06-23 DIAGNOSIS — E785 Hyperlipidemia, unspecified: Secondary | ICD-10-CM

## 2021-06-23 DIAGNOSIS — Z789 Other specified health status: Secondary | ICD-10-CM

## 2021-06-23 DIAGNOSIS — F1090 Alcohol use, unspecified, uncomplicated: Secondary | ICD-10-CM

## 2021-06-23 NOTE — Progress Notes (Signed)
? ? ?Careteam: ?Patient Care Team: ?Sharon Seller, NP as PCP - General (Geriatric Medicine) ? ?PLACE OF SERVICE:  ?[facility]  ?Advanced Directive information ?Does Patient Have a Medical Advance Directive?: No, Would patient like information on creating a medical advance directive?: No - Patient declined ? ?No Known Allergies ? ?Chief Complaint  ?Patient presents with  ? Follow-up  ?  PT is here for a 6 week follow-up on blood pressure and cholesterol.   ? ? ? ?HPI: Patient is a 66 y.o. female for follow up.  ? ?Cholesterol very high on lab, she was prescribed Lipitor after labs were drawn and has been taking for about a month. Does not want follow up lab today.  ?No side effects noted from Lipitor.  ?Drinking less beer.  ?She is not planning on scheduling anything until after she gets her teeth removed. Awaiting call from dentist.   ? ?Also reports worsening allergies, runny nose, itchy eyes at this time.  ? ?Review of Systems:  ?Review of Systems  ?Constitutional:  Negative for chills, fever and weight loss.  ?HENT:  Positive for congestion. Negative for tinnitus.   ?     Mouth pain due to poor dentition.   ?Respiratory:  Negative for cough, sputum production and shortness of breath.   ?Cardiovascular:  Negative for chest pain, palpitations and leg swelling.  ?Gastrointestinal:  Negative for abdominal pain, constipation, diarrhea and heartburn.  ?Genitourinary:  Negative for dysuria, frequency and urgency.  ?Skin: Negative.   ?Neurological:  Negative for dizziness and headaches.  ?Endo/Heme/Allergies:  Positive for environmental allergies.  ?Psychiatric/Behavioral:  Negative for memory loss. The patient is nervous/anxious. The patient does not have insomnia.   ? ?Past Medical History:  ?Diagnosis Date  ? History of broken leg   ? ?History reviewed. No pertinent surgical history. ?Social History: ?  reports that she has been smoking cigarettes. She has a 75.00 pack-year smoking history. She has never used  smokeless tobacco. She reports current alcohol use of about 6.0 - 8.0 standard drinks per week. She reports that she does not use drugs. ? ?Family History  ?Problem Relation Age of Onset  ? Stroke Mother   ? Diabetes Mother   ? Heart attack Father   ? Cancer Father   ? ? ?Medications: ?Patient's Medications  ?New Prescriptions  ? No medications on file  ?Previous Medications  ? ATORVASTATIN (LIPITOR) 10 MG TABLET    Take 1 tablet (10 mg total) by mouth daily.  ? DIPHENHYDRAMINE HCL PO    Take 1 capsule by mouth daily as needed.  ? IBUPROFEN (ADVIL) 200 MG TABLET    Take 200 mg by mouth as needed.  ? MULTIPLE VITAMINS-MINERALS (ONE-A-DAY WOMENS) TABLET    Take 1 tablet by mouth daily.  ?Modified Medications  ? No medications on file  ?Discontinued Medications  ? No medications on file  ? ? ?Physical Exam: ? ?Vitals:  ? 06/23/21 1341  ?BP: 130/88  ?Pulse: 90  ?Resp: 18  ?Temp: 97.6 ?F (36.4 ?C)  ?TempSrc: Temporal  ?SpO2: 99%  ?Weight: 121 lb (54.9 kg)  ?Height: 5\' 5"  (1.651 m)  ? ?Body mass index is 20.14 kg/m?. ?Wt Readings from Last 3 Encounters:  ?06/23/21 121 lb (54.9 kg)  ?05/12/21 122 lb (55.3 kg)  ?09/15/16 112 lb 8 oz (51 kg)  ? ? ?Physical Exam ?Constitutional:   ?   General: She is not in acute distress. ?   Appearance: She is well-developed. She is  not diaphoretic.  ?HENT:  ?   Head: Normocephalic and atraumatic.  ?   Mouth/Throat:  ?   Pharynx: No oropharyngeal exudate.  ?Eyes:  ?   Conjunctiva/sclera: Conjunctivae normal.  ?   Pupils: Pupils are equal, round, and reactive to light.  ?Cardiovascular:  ?   Rate and Rhythm: Normal rate and regular rhythm.  ?   Heart sounds: Normal heart sounds.  ?Pulmonary:  ?   Effort: Pulmonary effort is normal.  ?   Breath sounds: Normal breath sounds.  ?Abdominal:  ?   General: Bowel sounds are normal.  ?   Palpations: Abdomen is soft.  ?Musculoskeletal:  ?   Cervical back: Normal range of motion and neck supple.  ?   Right lower leg: No edema.  ?   Left lower leg: No  edema.  ?Skin: ?   General: Skin is warm and dry.  ?Neurological:  ?   Mental Status: She is alert.  ?Psychiatric:     ?   Mood and Affect: Mood normal.  ? ? ?Labs reviewed: ?Basic Metabolic Panel: ?Recent Labs  ?  05/12/21 ?1351  ?NA 140  ?K 4.6  ?CL 103  ?CO2 24  ?GLUCOSE 93  ?BUN 6*  ?CREATININE 0.42*  ?CALCIUM 9.7  ? ?Liver Function Tests: ?Recent Labs  ?  05/12/21 ?1351  ?AST 15  ?ALT 10  ?BILITOT 0.5  ?PROT 7.3  ? ?No results for input(s): LIPASE, AMYLASE in the last 8760 hours. ?No results for input(s): AMMONIA in the last 8760 hours. ?CBC: ?Recent Labs  ?  05/12/21 ?1351  ?WBC 12.3*  ?NEUTROABS 8,770*  ?HGB 15.4  ?HCT 45.5*  ?MCV 101.1*  ?PLT 276  ? ?Lipid Panel: ?Recent Labs  ?  05/12/21 ?1351  ?CHOL 308*  ?HDL 86  ?LDLCALC 204*  ?TRIG 76  ?CHOLHDL 3.6  ? ?TSH: ?No results for input(s): TSH in the last 8760 hours. ?A1C: ?No results found for: HGBA1C ? ? ?Assessment/Plan ?1. Macrocytosis without anemia ?-she has started her b12 supplement, does not want follow up labs today ?- B12 and Folate Panel; Future ? ?2. Alcohol use ?-encouraged to continue to cut back. Reports she is not drinking if she has to take tylenol for pain.  ? ?3. Hyperlipidemia ?-tolerating statin, does not wish to follow up blood work today ?-will follow up labs at next visit.  ? ?Follow up: 08/04/2021 ?Debbie Luna. Janyth Contes, AGNP ? ?[facility] ?[phone]  ?

## 2021-07-24 ENCOUNTER — Ambulatory Visit (INDEPENDENT_AMBULATORY_CARE_PROVIDER_SITE_OTHER): Payer: Medicare PPO | Admitting: Nurse Practitioner

## 2021-07-24 ENCOUNTER — Encounter: Payer: Self-pay | Admitting: Nurse Practitioner

## 2021-07-24 DIAGNOSIS — M5441 Lumbago with sciatica, right side: Secondary | ICD-10-CM | POA: Diagnosis not present

## 2021-07-24 DIAGNOSIS — K089 Disorder of teeth and supporting structures, unspecified: Secondary | ICD-10-CM

## 2021-07-24 DIAGNOSIS — M5442 Lumbago with sciatica, left side: Secondary | ICD-10-CM

## 2021-07-24 DIAGNOSIS — G8929 Other chronic pain: Secondary | ICD-10-CM | POA: Diagnosis not present

## 2021-07-24 DIAGNOSIS — I739 Peripheral vascular disease, unspecified: Secondary | ICD-10-CM | POA: Diagnosis not present

## 2021-07-24 NOTE — Progress Notes (Signed)
Careteam: Patient Care Team: Sharon Seller, NP as PCP - General (Geriatric Medicine)  Advanced Directive information Does Patient Have a Medical Advance Directive?: No, Would patient like information on creating a medical advance directive?: No - Patient declined  No Known Allergies  Chief Complaint  Patient presents with   Acute Visit    Patient complains of legs hurting and having 9 teeth pulled on 07/02/2021.     HPI: Patient is a 66 y.o. female via telephone visit.   She reports she thought she was having 4 teeth pulled but went back on the 24th of May and had 9 teeth pulled. She now does not have any teeth.  Reports her pain in her mouth is getting better.  She has an area in her mouth that needs to be evaluated, part of a tooth coming out of her gum.  Needs to eat but having trouble with that.   Reports she is having issues with feet and legs.  Reports her feet are numb.  Reports she has a hard time mowing the yard because her legs hurt all the way up to her buttock.  She has chronic back pain and was told she had sciatica in the past.  She has had her back go out in the past.  She is not able to walk or exercise.  She has to sit down and rest because her legs hurt.  Reports pains have been going on years but has worsen in the last month or 2.    Review of Systems:  Review of Systems  Constitutional:  Negative for chills and fever.  Respiratory:  Positive for cough.   Cardiovascular:  Positive for claudication. Negative for chest pain and leg swelling.  Musculoskeletal:  Positive for back pain and myalgias.  Neurological:  Positive for tingling and tremors. Negative for dizziness and weakness.   Past Medical History:  Diagnosis Date   History of broken leg    History reviewed. No pertinent surgical history. Social History:   reports that she has been smoking cigarettes. She has a 75.00 pack-year smoking history. She has never used smokeless tobacco. She  reports current alcohol use of about 6.0 - 8.0 standard drinks of alcohol per week. She reports that she does not use drugs.  Family History  Problem Relation Age of Onset   Stroke Mother    Diabetes Mother    Heart attack Father    Cancer Father     Medications: Patient's Medications  New Prescriptions   No medications on file  Previous Medications   ATORVASTATIN (LIPITOR) 10 MG TABLET    Take 1 tablet (10 mg total) by mouth daily.   DIPHENHYDRAMINE HCL PO    Take 1 capsule by mouth daily as needed.   IBUPROFEN (ADVIL) 200 MG TABLET    Take 200 mg by mouth as needed.   MULTIPLE VITAMINS-MINERALS (ONE-A-DAY WOMENS) TABLET    Take 1 tablet by mouth daily.  Modified Medications   No medications on file  Discontinued Medications   No medications on file    Physical Exam:  There were no vitals filed for this visit. There is no height or weight on file to calculate BMI. Wt Readings from Last 3 Encounters:  06/23/21 121 lb (54.9 kg)  05/12/21 122 lb (55.3 kg)  09/15/16 112 lb 8 oz (51 kg)     Labs reviewed: Basic Metabolic Panel: Recent Labs    05/12/21 1351  NA 140  K 4.6  CL 103  CO2 24  GLUCOSE 93  BUN 6*  CREATININE 0.42*  CALCIUM 9.7   Liver Function Tests: Recent Labs    05/12/21 1351  AST 15  ALT 10  BILITOT 0.5  PROT 7.3   No results for input(s): "LIPASE", "AMYLASE" in the last 8760 hours. No results for input(s): "AMMONIA" in the last 8760 hours. CBC: Recent Labs    05/12/21 1351  WBC 12.3*  NEUTROABS 8,770*  HGB 15.4  HCT 45.5*  MCV 101.1*  PLT 276   Lipid Panel: Recent Labs    05/12/21 1351  CHOL 308*  HDL 86  LDLCALC 204*  TRIG 76  CHOLHDL 3.6   TSH: No results for input(s): "TSH" in the last 8760 hours. A1C: No results found for: "HGBA1C"   Assessment/Plan 1. Claudication [facility]) -continues on ASA and statin. Will evaluate with ABI at this time and she will follow up in office for ongoing evaluation  - VAS [place] ABI WITH/WO  TBI; Future  2. Chronic bilateral low back pain with bilateral sciatica She has had a hx of back pain but no recent imaging. Will get xray of lumbar spine and see her in office for evaluation.  - DG Lumbar Spine Complete; Future  3. Poor dentition -continues to follow up with dentist s/p 9 extractions.   Janene Harvey. Biagio Borg  [facility] [phone]    Virtual Visit via telephone  I connected with patient on 07/24/21 at 10:00 AM EDT by telephone and verified that I am speaking with the correct person using two identifiers.  Location: Patient: home Provider: [place]    I discussed the limitations, risks, security and privacy concerns of performing an evaluation and management service by telephone and the availability of in person appointments. I also discussed with the patient that there may be a patient responsible charge related to this service. The patient expressed understanding and agreed to proceed.   I discussed the assessment and treatment plan with the patient. The patient was provided an opportunity to ask questions and all were answered. The patient agreed with the plan and demonstrated an understanding of the instructions.   The patient was advised to call back or seek an in-person evaluation if the symptoms worsen or if the condition fails to improve as anticipated.  I provided 14 minutes of non-face-to-face time during this encounter.  Janene Harvey. Biagio Borg Avs printed and mailed

## 2021-07-24 NOTE — Progress Notes (Signed)
  This service is provided via telemedicine  No vital signs collected/recorded due to the encounter was a telemedicine visit.   Location of patient (ex: home, work):  Home  Patient consents to a telephone visit:  Yes  Location of the provider (ex: office, home):  [employer].  Name of any referring provider:  Sharon Seller, NP   Names of all persons participating in the telemedicine service and their role in the encounter:  Patient, Meda Klinefelter, RMA, Abbey Chatters NP.    Time spent on call:  8 minutes spent on the phone with Medical Assistant.

## 2021-08-04 ENCOUNTER — Other Ambulatory Visit: Payer: Medicare PPO

## 2021-08-04 DIAGNOSIS — E785 Hyperlipidemia, unspecified: Secondary | ICD-10-CM | POA: Diagnosis not present

## 2021-08-04 DIAGNOSIS — R7309 Other abnormal glucose: Secondary | ICD-10-CM | POA: Diagnosis not present

## 2021-08-04 DIAGNOSIS — E538 Deficiency of other specified B group vitamins: Secondary | ICD-10-CM | POA: Diagnosis not present

## 2021-08-04 DIAGNOSIS — Z789 Other specified health status: Secondary | ICD-10-CM | POA: Diagnosis not present

## 2021-08-04 DIAGNOSIS — D7589 Other specified diseases of blood and blood-forming organs: Secondary | ICD-10-CM | POA: Diagnosis not present

## 2021-08-04 LAB — CBC WITH DIFFERENTIAL/PLATELET
Absolute Monocytes: 1220 cells/uL — ABNORMAL HIGH (ref 200–950)
Basophils Absolute: 45 cells/uL (ref 0–200)
Basophils Relative: 0.4 %
HCT: 45.7 % — ABNORMAL HIGH (ref 35.0–45.0)
MCHC: 33.9 g/dL (ref 32.0–36.0)
MPV: 10.2 fL (ref 7.5–12.5)
Monocytes Relative: 10.8 %
Platelets: 315 10*3/uL (ref 140–400)
RBC: 4.5 10*6/uL (ref 3.80–5.10)
RDW: 12.7 % (ref 11.0–15.0)
WBC: 11.3 10*3/uL — ABNORMAL HIGH (ref 3.8–10.8)

## 2021-08-04 LAB — COMPLETE METABOLIC PANEL WITH GFR
Albumin: 4.6 g/dL (ref 3.6–5.1)
Calcium: 10.4 mg/dL (ref 8.6–10.4)
Chloride: 104 mmol/L (ref 98–110)
Glucose, Bld: 114 mg/dL — ABNORMAL HIGH (ref 65–99)
Sodium: 141 mmol/L (ref 135–146)
Total Protein: 7.6 g/dL (ref 6.1–8.1)
eGFR: 97 mL/min/{1.73_m2} (ref >=60)

## 2021-08-04 LAB — LIPID PANEL
Cholesterol: 271 mg/dL — ABNORMAL HIGH (ref <200)
LDL Cholesterol (Calc): 174 mg/dL (calc) — ABNORMAL HIGH
Triglycerides: 97 mg/dL (ref <150)

## 2021-08-05 ENCOUNTER — Telehealth: Payer: Self-pay | Admitting: Nurse Practitioner

## 2021-08-05 LAB — COMPLETE METABOLIC PANEL WITH GFR
AG Ratio: 1.5 (calc) (ref 1.0–2.5)
ALT: 15 U/L (ref 6–29)
BUN: 8 mg/dL (ref 7–25)
Globulin: 3 g/dL (calc) (ref 1.9–3.7)
Potassium: 4.9 mmol/L (ref 3.5–5.3)
Total Bilirubin: 0.8 mg/dL (ref 0.2–1.2)

## 2021-08-05 LAB — CBC WITH DIFFERENTIAL/PLATELET
Eosinophils Absolute: 57 cells/uL (ref 15–500)
Eosinophils Relative: 0.5 %
Lymphs Abs: 2509 cells/uL (ref 850–3900)
MCH: 34.4 pg — ABNORMAL HIGH (ref 27.0–33.0)
Neutro Abs: 7469 cells/uL (ref 1500–7800)
Neutrophils Relative %: 66.1 %

## 2021-08-05 LAB — B12 AND FOLATE PANEL
Folate: >24 ng/mL
Vitamin B-12: 713 pg/mL (ref 200–1100)

## 2021-08-05 LAB — TEST AUTHORIZATION

## 2021-08-05 LAB — LIPID PANEL: Total CHOL/HDL Ratio: 3.6 (calc) (ref <5.0)

## 2021-08-06 LAB — COMPLETE METABOLIC PANEL WITH GFR
AST: 20 U/L (ref 10–35)
Alkaline phosphatase (APISO): 86 U/L (ref 37–153)
CO2: 27 mmol/L (ref 20–32)
Creat: 0.63 mg/dL (ref 0.50–1.05)

## 2021-08-06 LAB — TEST AUTHORIZATION

## 2021-08-06 LAB — LIPID PANEL
HDL: 75 mg/dL (ref >=50)
Non-HDL Cholesterol (Calc): 196 mg/dL (calc) — ABNORMAL HIGH (ref <130)

## 2021-08-06 LAB — HEMOGLOBIN A1C
Hgb A1c MFr Bld: 5.2 % of total Hgb (ref <5.7)
Mean Plasma Glucose: 103 mg/dL
eAG (mmol/L): 5.7 mmol/L

## 2021-08-06 LAB — CBC WITH DIFFERENTIAL/PLATELET
Hemoglobin: 15.5 g/dL (ref 11.7–15.5)
MCV: 101.6 fL — ABNORMAL HIGH (ref 80.0–100.0)
Total Lymphocyte: 22.2 %

## 2021-08-06 NOTE — Telephone Encounter (Signed)
Called and spoke with Para March [phone] and he stated that he was calling for patient's insurance, there is not a copy of insurance in patient's chart.   No copy but under Primary C[facility] it states that she has [employer] and Medicaid.   He stated that when patient comes in they would get a copy of the care.   He also stated that he has been trying to call patient but she will not answer/return call. Asked if our office could give her a call and give her his number to call to schedule an appointment #[phone]   Called and spoke with patient and gave her [facility]'s number. She stated that she will call Para March and schedule the appointment.

## 2021-08-08 ENCOUNTER — Ambulatory Visit: Payer: Medicare PPO | Admitting: Nurse Practitioner

## 2021-08-25 ENCOUNTER — Ambulatory Visit
Admission: RE | Admit: 2021-08-25 | Discharge: 2021-08-25 | Disposition: A | Discharge status: Home / Self Care | Payer: Medicare PPO | Source: Ambulatory Visit | Attending: Nurse Practitioner | Admitting: Nurse Practitioner

## 2021-08-25 DIAGNOSIS — G8929 Other chronic pain: Secondary | ICD-10-CM

## 2021-08-25 DIAGNOSIS — M545 Low back pain, unspecified: Secondary | ICD-10-CM | POA: Diagnosis not present

## 2021-08-25 DIAGNOSIS — M5136 Other intervertebral disc degeneration, lumbar region: Secondary | ICD-10-CM | POA: Diagnosis not present

## 2021-08-26 ENCOUNTER — Telehealth: Payer: Self-pay

## 2021-08-26 NOTE — Telephone Encounter (Signed)
Debbie Luna states someone called her.  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 "  Patient has an apnt Friday to get her varicose veins checked out before PT.

## 2021-08-29 ENCOUNTER — Ambulatory Visit (HOSPITAL_COMMUNITY)
Admission: RE | Admit: 2021-08-29 | Discharge: 2021-08-29 | Disposition: A | Discharge status: Home / Self Care | Payer: Medicare PPO | Source: Ambulatory Visit | Attending: Vascular Surgery | Admitting: Vascular Surgery

## 2021-08-29 DIAGNOSIS — I739 Peripheral vascular disease, unspecified: Secondary | ICD-10-CM | POA: Diagnosis not present

## 2021-09-10 ENCOUNTER — Telehealth: Payer: Self-pay | Admitting: *Deleted

## 2021-09-10 NOTE — Telephone Encounter (Signed)
Patient calling requesting the results from her Doppler Study she had done on 7/21.   Results are scanned in patient's chart under "Media"  Please Advise.

## 2021-09-11 NOTE — Telephone Encounter (Signed)
Patient stated that she has already been to a Vascular Dr and she does not understand what her Diagnosis is.  Keeps saying no one called her with these results.   Stated that its the heat affecting her legs.  Patient is sounding very confused and keeps talking to herself and keeps stating that she does not understand and she just needs (and then just stops talking.)  Read the results to her 3 times and she states that she has already seen Vascular.   Patient is requesting a call from [place]. I offered patient an appointment and Telephone appointment and patient refuses with confusion.

## 2021-09-11 NOTE — Telephone Encounter (Signed)
Called patient and LMOM to return call.

## 2021-09-11 NOTE — Telephone Encounter (Signed)
Results were already called to her. Recommending referral to vascular.     Jonni Sanger, CMA  09/01/2021 11:09 AM EDT     Spoke with patient about the results stated that she went and saw a vascular specialist on Friday and that they would be reaching out to [facility] once the results are ready. Patient also stated that if we need to schedule her an appointment it will have to be at least 4 days out for her to get a ride scheduled with Social Services to bring her to the appointment. Nothing else follows at this time, routed to PCP.   Sharon Seller, NP  09/01/2021  8:44 AM EDT     Left leg showing some decrease in circulation, recommending a vascular referral at this time.

## 2021-09-12 NOTE — Telephone Encounter (Signed)
She did not see a vascular doctor.  Lets see if we can not bring her into the office to discuss things.

## 2021-09-12 NOTE — Telephone Encounter (Signed)
Patient Refuses appointment.

## 2022-05-11 DEATH — deceased

## 2022-06-22 ENCOUNTER — Other Ambulatory Visit: Payer: Self-pay | Admitting: Nurse Practitioner

## 2022-06-22 DIAGNOSIS — E785 Hyperlipidemia, unspecified: Secondary | ICD-10-CM
